# Patient Record
Sex: Female | Born: 2000 | Hispanic: No | Marital: Single | Smoking: Never smoker
Health system: Southern US, Community
[De-identification: ages and names within clinical notes are randomized; demographics above are authoritative.]

## PROBLEM LIST (undated history)

## (undated) ENCOUNTER — Inpatient Hospital Stay (HOSPITAL_COMMUNITY): Payer: Self-pay

## (undated) DIAGNOSIS — O09299 Supervision of pregnancy with other poor reproductive or obstetric history, unspecified trimester: Secondary | ICD-10-CM

## (undated) DIAGNOSIS — Z98891 History of uterine scar from previous surgery: Secondary | ICD-10-CM

---
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
## 2018-05-28 ENCOUNTER — Other Ambulatory Visit: Payer: Self-pay

## 2018-05-28 ENCOUNTER — Encounter (HOSPITAL_COMMUNITY): Payer: Self-pay | Admitting: Emergency Medicine

## 2018-05-28 ENCOUNTER — Ambulatory Visit (HOSPITAL_COMMUNITY)
Admission: EM | Admit: 2018-05-28 | Discharge: 2018-05-28 | Disposition: A | Discharge status: Home / Self Care | Payer: 59 | Attending: Family Medicine | Admitting: Family Medicine

## 2018-05-28 DIAGNOSIS — N939 Abnormal uterine and vaginal bleeding, unspecified: Secondary | ICD-10-CM

## 2018-05-28 LAB — POCT URINALYSIS DIP (DEVICE)
BILIRUBIN URINE: NEGATIVE
Glucose, UA: NEGATIVE mg/dL
Ketones, ur: NEGATIVE mg/dL
LEUKOCYTES UA: NEGATIVE
NITRITE: NEGATIVE
Protein, ur: NEGATIVE mg/dL
Specific Gravity, Urine: 1.025 (ref 1.005–1.030)
Urobilinogen, UA: 0.2 mg/dL (ref 0.0–1.0)
pH: 8.5 — ABNORMAL HIGH (ref 5.0–8.0)

## 2018-05-28 NOTE — Discharge Instructions (Addendum)
Negative urine pregnancy.  Infection can be a source of abnormal bleeding.  We have tested you today for sources of infection within the vagina. Will notify you of any positive findings and if any changes to treatment are needed.  You may monitor your results on your MyChart online as well.   Return to be seen or go to the ER if develop worsening of bleeding or heavy flow, dizziness, lightheadedness, pelvic pain or fevers.  Please establish with a primary care provider and/or gynecologist for further evaluation of persistent symptoms.

## 2018-05-28 NOTE — ED Triage Notes (Signed)
Spotting today, denies pain.  10/10 was her last period.  Patient wants a pregnancy test and std testing

## 2018-05-28 NOTE — ED Provider Notes (Signed)
[facility]    CSN: [id number] Arrival date & time: 05/28/18  1717     History   Chief Complaint Chief Complaint  Patient presents with  . Vaginal Bleeding    HPI Kathryn Clark is a 17 y.o. female.   Kathryn Clark presents with complaints of vaginal spotting which started yesterday. Hasn't increased. LMP was 10/10 and usually is regular. She is not on birth control. Denies any other vaginal discharge, itching or irritation. No sores or lesions. No pelvic pain, no back pain. No fevers. No urinary symptoms. Doesn't have a PCP or gynecologist. Sexually active with 1 partner, doesn't use condoms. No known STD exposure or concern. Without contributing medical history.      ROS per HPI.      History reviewed. No pertinent past medical history.  There are no active problems to display for this patient.   History reviewed. No pertinent surgical history.  OB History   None      Home Medications    Prior to Admission medications   Not on File    Family History Family History  Problem Relation Age of Onset  . Healthy Mother   . Healthy Father     Social History Social History   Tobacco Use  . Smoking status: Never Smoker  . Smokeless tobacco: Never Used  Substance Use Topics  . Alcohol use: No  . Drug use: No     Allergies   Patient has no known allergies.   Review of Systems Review of Systems   Physical Exam Triage Vital Signs ED Triage Vitals  Enc Vitals Group     BP 05/28/18 1729 (!) 142/77     Pulse Rate 05/28/18 1729 (!) 102     Resp 05/28/18 1729 16     Temp 05/28/18 1729 98.7 F (37.1 C)     Temp Source 05/28/18 1729 Oral     SpO2 05/28/18 1729 100 %     Weight --      Height --      Head Circumference --      Peak Flow --      Pain Score 05/28/18 1727 0     Pain Loc --      Pain Edu? --      Excl. in GC? --    No data found.  Updated Vital Signs BP (!) 142/77 (BP Location: Right Arm)   Pulse (!) 102   Temp 98.7 F  (37.1 C) (Oral)   Resp 16   LMP 05/10/2018   SpO2 100%    Physical Exam  Constitutional: She is oriented to person, place, and time. She appears well-developed and well-nourished. No distress.  Cardiovascular: Normal rate, regular rhythm and normal heart sounds.  Pulmonary/Chest: Effort normal and breath sounds normal.  Abdominal: Soft. There is no tenderness. There is no rigidity, no rebound, no guarding and no CVA tenderness.  Genitourinary: Uterus normal. There is no rash or lesion on the right labia. There is no rash or lesion on the left labia. Cervix exhibits no motion tenderness, no discharge and no friability. Right adnexum displays no tenderness. Left adnexum displays no tenderness. There is bleeding in the vagina.  Genitourinary Comments:  Scant dark red blood from cervix and within vagina; mild odor, no other obvious; no pain, no CMT  Neurological: She is alert and oriented to person, place, and time.  Skin: Skin is warm and dry.     UC Treatments / Results  Labs (all  labs ordered are listed, but only abnormal results are displayed) Labs Reviewed  POCT URINALYSIS DIP (DEVICE) - Abnormal; Notable for the following components:      Result Value   Hgb urine dipstick MODERATE (*)    pH 8.5 (*)    All other components within normal limits  CERVICOVAGINAL ANCILLARY ONLY    EKG None  Radiology No results found.  Procedures Procedures (including critical care time)  Medications Ordered in UC Medications - No data to display  Initial Impression / Assessment and Plan / UC Course  I have reviewed the triage vital signs and the nursing notes.  Pertinent labs & imaging results that were available during my care of the patient were reviewed by me and considered in my medical decision making (see chart for details).     Vaginal cytology pending. Will notify of any positive findings and if any changes to treatment are needed.  Spotting. No dizziness, lightheadedness. No  pain. No fevers. Awaiting final results to treat appropriately. Return precautions provided. If symptoms worsen or do not improve in the next week to return to be seen or to follow up with PCP or gynecologist. Patient verbalized understanding and agreeable to plan.     Final Clinical Impressions(s) / UC Diagnoses   Final diagnoses:  Abnormal uterine bleeding     Discharge Instructions     Negative urine pregnancy.  Infection can be a source of abnormal bleeding.  We have tested you today for sources of infection within the vagina. Will notify you of any positive findings and if any changes to treatment are needed.  You may monitor your results on your MyChart online as well.   Return to be seen or go to the ER if develop worsening of bleeding or heavy flow, dizziness, lightheadedness, pelvic pain or fevers.  Please establish with a primary care provider and/or gynecologist for further evaluation of persistent symptoms.    ED Prescriptions    None     Controlled Substance Prescriptions  Controlled Substance Registry consulted? Not Applicable   Georgetta Haber, NP 05/28/18 1758

## 2018-05-29 ENCOUNTER — Telehealth (HOSPITAL_COMMUNITY): Payer: Self-pay

## 2018-05-29 LAB — CERVICOVAGINAL ANCILLARY ONLY
BACTERIAL VAGINITIS: NEGATIVE
CANDIDA VAGINITIS: POSITIVE — AB
Chlamydia: NEGATIVE
Neisseria Gonorrhea: NEGATIVE
Trichomonas: NEGATIVE

## 2018-05-29 LAB — POCT PREGNANCY, URINE: PREG TEST UR: NEGATIVE

## 2018-05-29 MED ORDER — FLUCONAZOLE 150 MG PO TABS: 150.0000 mg | ORAL_TABLET | Freq: Every day | ORAL | 0 refills | Status: DC

## 2018-05-29 MED ORDER — FLUCONAZOLE 150 MG PO TABS: 150.0000 mg | ORAL_TABLET | Freq: Every day | ORAL | 0 refills | Status: AC

## 2018-05-29 NOTE — Telephone Encounter (Signed)
Test for candida (yeast) was positive.  Prescription for fluconazole 150mg po now, repeat dose in 3d if needed, #2 no refills, sent to the pharmacy of record.  Recheck or followup with PCP for further evaluation if symptoms are not improving.    Attempted to reach patient. No answer at this time. Voicemail left.     

## 2018-08-06 ENCOUNTER — Encounter (HOSPITAL_COMMUNITY): Payer: Self-pay

## 2018-08-06 ENCOUNTER — Emergency Department (HOSPITAL_COMMUNITY)
Admission: EM | Admit: 2018-08-06 | Discharge: 2018-08-07 | Disposition: A | Discharge status: Home / Self Care | Payer: 59 | Attending: Emergency Medicine | Admitting: Emergency Medicine

## 2018-08-06 ENCOUNTER — Other Ambulatory Visit: Payer: Self-pay

## 2018-08-06 DIAGNOSIS — O26891 Other specified pregnancy related conditions, first trimester: Secondary | ICD-10-CM | POA: Diagnosis not present

## 2018-08-06 DIAGNOSIS — O418X10 Other specified disorders of amniotic fluid and membranes, first trimester, not applicable or unspecified: Secondary | ICD-10-CM | POA: Diagnosis not present

## 2018-08-06 DIAGNOSIS — R1031 Right lower quadrant pain: Secondary | ICD-10-CM | POA: Diagnosis present

## 2018-08-06 DIAGNOSIS — R102 Pelvic and perineal pain: Secondary | ICD-10-CM | POA: Insufficient documentation

## 2018-08-06 DIAGNOSIS — R1032 Left lower quadrant pain: Secondary | ICD-10-CM | POA: Diagnosis not present

## 2018-08-06 DIAGNOSIS — Z3A01 Less than 8 weeks gestation of pregnancy: Secondary | ICD-10-CM | POA: Insufficient documentation

## 2018-08-06 DIAGNOSIS — O26899 Other specified pregnancy related conditions, unspecified trimester: Secondary | ICD-10-CM

## 2018-08-06 DIAGNOSIS — R1030 Lower abdominal pain, unspecified: Secondary | ICD-10-CM

## 2018-08-06 DIAGNOSIS — Z3401 Encounter for supervision of normal first pregnancy, first trimester: Secondary | ICD-10-CM

## 2018-08-06 DIAGNOSIS — O468X1 Other antepartum hemorrhage, first trimester: Secondary | ICD-10-CM

## 2018-08-06 NOTE — ED Triage Notes (Signed)
Pt reports RLQ and R sided cramping onset 1730 this evening after intercourse. Pt is currently 7 wks preg. Denies vag bleeding or loss of fluids. Denies N/V/D.

## 2018-08-07 ENCOUNTER — Emergency Department (HOSPITAL_COMMUNITY): Payer: 59

## 2018-08-07 DIAGNOSIS — O26891 Other specified pregnancy related conditions, first trimester: Secondary | ICD-10-CM | POA: Diagnosis not present

## 2018-08-07 LAB — URINALYSIS, ROUTINE W REFLEX MICROSCOPIC
BILIRUBIN URINE: NEGATIVE
Glucose, UA: NEGATIVE mg/dL
Hgb urine dipstick: NEGATIVE
KETONES UR: NEGATIVE mg/dL
LEUKOCYTES UA: NEGATIVE
NITRITE: NEGATIVE
Protein, ur: NEGATIVE mg/dL
Specific Gravity, Urine: 1.025 (ref 1.005–1.030)
pH: 6 (ref 5.0–8.0)

## 2018-08-07 LAB — LIPASE, BLOOD: LIPASE: 33 U/L (ref 11–51)

## 2018-08-07 LAB — COMPREHENSIVE METABOLIC PANEL
ALT: 9 U/L (ref 0–44)
AST: 16 U/L (ref 15–41)
Albumin: 3.9 g/dL (ref 3.5–5.0)
Alkaline Phosphatase: 44 U/L (ref 38–126)
Anion gap: 7 (ref 5–15)
BILIRUBIN TOTAL: 0.7 mg/dL (ref 0.3–1.2)
BUN: 5 mg/dL — AB (ref 6–20)
CHLORIDE: 105 mmol/L (ref 98–111)
CO2: 23 mmol/L (ref 22–32)
CREATININE: 0.64 mg/dL (ref 0.44–1.00)
Calcium: 9.3 mg/dL (ref 8.9–10.3)
GFR calc non Af Amer: >60 mL/min (ref >60)
Glucose, Bld: 78 mg/dL (ref 70–99)
POTASSIUM: 3.6 mmol/L (ref 3.5–5.1)
Sodium: 135 mmol/L (ref 135–145)
TOTAL PROTEIN: 6.5 g/dL (ref 6.5–8.1)

## 2018-08-07 LAB — CBC
HCT: 34.9 % — ABNORMAL LOW (ref 36.0–46.0)
HEMOGLOBIN: 11.2 g/dL — AB (ref 12.0–15.0)
MCH: 29.2 pg (ref 26.0–34.0)
MCHC: 32.1 g/dL (ref 30.0–36.0)
MCV: 90.9 fL (ref 80.0–100.0)
NRBC: 0 % (ref 0.0–0.2)
Platelets: 193 10*3/uL (ref 150–400)
RBC: 3.84 MIL/uL — ABNORMAL LOW (ref 3.87–5.11)
RDW: 13.1 % (ref 11.5–15.5)
WBC: 7.1 10*3/uL (ref 4.0–10.5)

## 2018-08-07 LAB — HCG, QUANTITATIVE, PREGNANCY: HCG, BETA CHAIN, QUANT, S: 70844 m[IU]/mL — AB (ref <5)

## 2018-08-07 NOTE — ED Provider Notes (Signed)
[facility] EMERGENCY DEPARTMENT Provider Note   CSN: [id number] Arrival date & time: 08/06/18  2308     History   Chief Complaint Chief Complaint  Patient presents with  . Abdominal Pain    HPI Kathryn Clark is a 18 y.o. female.  Pt reports RLQ and R sided cramping onset 1730 this evening after intercourse. Pt is currently 7 wks preg. Denies vag bleeding or loss of fluids. Denies N/V/D. Pain has improved.  No cough or URI symptoms.  No prior pregnancies.  Uncomplicated thus far.    The history is provided by the patient. No language interpreter was used.  Abdominal Pain  Pain location:  RLQ and LLQ Pain quality: cramping   Pain radiates to:  Does not radiate Pain severity:  Mild Onset quality:  Sudden Duration:  5 hours Timing:  Intermittent Progression:  Unchanged Chronicity:  New Context: recent sexual activity   Relieved by:  None tried Worsened by:  Nothing Ineffective treatments:  None tried Associated symptoms: no anorexia, no chest pain, no chills, no constipation, no cough, no fever, no shortness of breath, no vaginal bleeding, no vaginal discharge and no vomiting     History reviewed. No pertinent past medical history.  There are no active problems to display for this patient.   History reviewed. No pertinent surgical history.   OB History    Gravida  2   Para      Term      Preterm      AB      Living        SAB      TAB      Ectopic      Multiple      Live Births               Home Medications    Prior to Admission medications   Not on File    Family History Family History  Problem Relation Age of Onset  . Healthy Mother   . Healthy Father     Social History Social History   Tobacco Use  . Smoking status: Never Smoker  . Smokeless tobacco: Never Used  Substance Use Topics  . Alcohol use: No  . Drug use: No     Allergies   Patient has no known allergies.   Review of Systems Review of  Systems  Constitutional: Negative for chills and fever.  Respiratory: Negative for cough and shortness of breath.   Cardiovascular: Negative for chest pain.  Gastrointestinal: Positive for abdominal pain. Negative for anorexia, constipation and vomiting.  Genitourinary: Negative for vaginal bleeding and vaginal discharge.  All other systems reviewed and are negative.    Physical Exam Updated Vital Signs BP 120/78   Pulse 87   Temp 99 F (37.2 C) (Oral)   Resp 20   Ht 5\' 4"  (1.626 m)   Wt 66.2 kg   LMP 06/20/2018   SpO2 100%   BMI 25.06 kg/m   Physical Exam Vitals signs and nursing note reviewed.  Constitutional:      Appearance: She is well-developed.  HENT:     Head: Normocephalic and atraumatic.     Right Ear: External ear normal.     Left Ear: External ear normal.  Eyes:     Conjunctiva/sclera: Conjunctivae normal.  Neck:     Musculoskeletal: Normal range of motion and neck supple.  Cardiovascular:     Rate and Rhythm: Normal rate.  Heart sounds: Normal heart sounds.  Pulmonary:     Effort: Pulmonary effort is normal.     Breath sounds: Normal breath sounds.  Abdominal:     General: Bowel sounds are normal.     Palpations: Abdomen is soft.     Tenderness: There is no abdominal tenderness. There is no rebound.     Comments: No pain to palpation of the rlq or llq on exam.  No rebound, no guarding.   Musculoskeletal: Normal range of motion.  Skin:    General: Skin is warm.  Neurological:     Mental Status: She is alert and oriented to person, place, and time.      ED Treatments / Results  Labs (all labs ordered are listed, but only abnormal results are displayed) Labs Reviewed  COMPREHENSIVE METABOLIC PANEL - Abnormal; Notable for the following components:      Result Value   BUN 5 (*)    All other components within normal limits  CBC - Abnormal; Notable for the following components:   RBC 3.84 (*)    Hemoglobin 11.2 (*)    HCT 34.9 (*)    All  other components within normal limits  HCG, QUANTITATIVE, PREGNANCY - Abnormal; Notable for the following components:   hCG, Beta Chain, Quant, S [id number],56270,[id number] (*)    All other components within normal limits  LIPASE, BLOOD  URINALYSIS, ROUTINE W REFLEX MICROSCOPIC    EKG None  Radiology [place] Ob Comp Less 14 Wks  Result Date: 08/07/2018 CLINICAL DATA:  Right lower quadrant pain.  Beta HCG [id number] EXAM: OBSTETRIC <14 WK ULTRASOUND TECHNIQUE: Transabdominal ultrasound was performed for evaluation of the gestation as well as the maternal uterus and adnexal regions. COMPARISON:  None. FINDINGS: Intrauterine gestational sac: Single Yolk sac:  Not Visualized. Embryo:  Visualized. Cardiac Activity: Visualized. Heart Rate: 140 bpm CRL:   8.1 mm   6 w 5 d                  US EDC: 03/28/2019 Subchorionic hemorrhage: Small 13 x 6 x 7 mm focus of perigestational hemorrhage. Maternal uterus/adnexae: The left ovary was not visualized. The right ovary was unremarkable measuring 3.9 x 3.1 x 2.5 cm. The uterus measured 9.4 x 5.6 x 7.1 cm. IMPRESSION: Viable 6 week 5 day single intrauterine gestation with adjacent small subchorionic focus of hemorrhage measuring 13 x 6 x 7 mm. Electronically Signed   By: Tollie Ethavid  Kwon M.D.   On: 08/07/2018 03:53    Procedures Procedures (including critical care time)  Medications Ordered in ED Medications - No data to display   Initial Impression / Assessment and Plan / ED Course  I have reviewed the triage vital signs and the nursing notes.  Pertinent labs & imaging results that were available during my care of the patient were reviewed by me and considered in my medical decision making (see chart for details).     18 year old who is [redacted] weeks pregnant who presents for acute onset of right lower quadrant pain and left lower quadrant pain.  No vaginal bleeding, no vaginal discharge.  No loss of fluids.  Symptoms started after intercourse.  Given history, will obtain quant hCG,  lipase, electrolytes, CBC, UA and urine culture.  UA without signs of infection, hCG consistent with 7-week pregnancy.  Normal electrolytes and lipase.  Ultrasound visualized by me noted to have intrauterine pregnancy.  Small subchorionic hemorrhage.  Discussed subchorionic hemorrhage with [facility] provider, who suggest the patient follow-up  with primary OB doctor, but no need for emergent referral.  Discussed findings with patient.  Continue to use ibuprofen as needed for pain.    Final Clinical Impressions(s) / ED Diagnoses   Final diagnoses:  Encounter for supervision of normal first pregnancy in first trimester  Lower abdominal pain  Subchorionic hematoma in first trimester, single or unspecified fetus    ED Discharge Orders    None       Niel HummerKuhner, Allis Quirarte, MD 08/07/18 [phone]

## 2018-08-07 NOTE — ED Notes (Signed)
Patient transported to US 

## 2018-08-21 LAB — OB RESULTS CONSOLE HEPATITIS B SURFACE ANTIGEN: Hepatitis B Surface Ag: NEGATIVE

## 2018-08-21 LAB — OB RESULTS CONSOLE GBS: GBS: POSITIVE

## 2018-08-21 LAB — OB RESULTS CONSOLE HIV ANTIBODY (ROUTINE TESTING): HIV: NONREACTIVE

## 2018-08-21 LAB — OB RESULTS CONSOLE RUBELLA ANTIBODY, IGM: Rubella: IMMUNE

## 2018-09-23 ENCOUNTER — Encounter (HOSPITAL_COMMUNITY): Payer: Self-pay

## 2018-09-23 ENCOUNTER — Inpatient Hospital Stay (HOSPITAL_COMMUNITY)
Admission: AD | Admit: 2018-09-23 | Discharge: 2018-09-23 | Disposition: A | Discharge status: Home / Self Care | Payer: 59 | Attending: Obstetrics and Gynecology | Admitting: Obstetrics and Gynecology

## 2018-09-23 DIAGNOSIS — Z3A13 13 weeks gestation of pregnancy: Secondary | ICD-10-CM | POA: Insufficient documentation

## 2018-09-23 DIAGNOSIS — O210 Mild hyperemesis gravidarum: Secondary | ICD-10-CM | POA: Insufficient documentation

## 2018-09-23 LAB — URINALYSIS, ROUTINE W REFLEX MICROSCOPIC
Bilirubin Urine: NEGATIVE
Glucose, UA: NEGATIVE mg/dL
Hgb urine dipstick: NEGATIVE
Ketones, ur: NEGATIVE mg/dL
LEUKOCYTE UA: NEGATIVE
NITRITE: NEGATIVE
PROTEIN: NEGATIVE mg/dL
SPECIFIC GRAVITY, URINE: 1.01 (ref 1.005–1.030)
pH: 9 — ABNORMAL HIGH (ref 5.0–8.0)

## 2018-09-23 MED ORDER — RANITIDINE HCL 150 MG PO TABS: 150.0000 mg | ORAL_TABLET | Freq: Two times a day (BID) | ORAL | 0 refills | Status: DC

## 2018-09-23 MED ORDER — SODIUM CHLORIDE 0.9 % IV SOLN
8.0000 mg | Freq: Once | INTRAVENOUS | Status: AC
  Administered 2018-09-23: 8 mg via INTRAVENOUS
  Filled 2018-09-23: qty 4

## 2018-09-23 MED ORDER — PROMETHAZINE HCL 12.5 MG PO TABS: 12.5000 mg | ORAL_TABLET | Freq: Four times a day (QID) | ORAL | 0 refills | Status: DC | PRN

## 2018-09-23 MED ORDER — LACTATED RINGERS IV SOLN
Freq: Once | INTRAVENOUS | Status: AC
  Administered 2018-09-23: 20:00:00 via INTRAVENOUS
  Filled 2018-09-23: qty 1000

## 2018-09-23 MED ORDER — PROMETHAZINE HCL 25 MG PO TABS
25.0000 mg | ORAL_TABLET | Freq: Once | ORAL | Status: AC
  Administered 2018-09-23: 25 mg via ORAL
  Filled 2018-09-23: qty 1

## 2018-09-23 NOTE — MAU Provider Note (Signed)
Chief Complaint: Nausea and Fatigue   First Provider Initiated Contact with Patient 09/23/18 1912     SUBJECTIVE HPI: Kathryn Clark is a 18 y.o. G1P0 at [redacted]w[redacted]d by LMP who presents to M[facility] Assessment Unit reporting N/V since last night, vomited 4-5x in 24 hrs, and generalized weakness/fatigue. She states that she woke up at 0400 feeling hungry, so she ate something. She then vomited. While at work she ate something at 1300 and then vomited, she ate something again, then vomited, ate 2 crackers, applesauce and some water then she vomited. She drank some water and then vomited again. She denies any recent sick contacts. She denies abdominal pain/cramping.  History reviewed. No pertinent past medical history. OB History  Gravida Para Term Preterm AB Living  1            SAB TAB Ectopic Multiple Live Births               # Outcome Date GA Lbr Len/2nd Weight Sex Delivery Anes PTL Lv  1 Current            History reviewed. No pertinent surgical history. Social History   Socioeconomic History  . Marital status: Single    Spouse name: Not on file  . Number of children: Not on file  . Years of education: Not on file  . Highest education level: Not on file  Occupational History  . Not on file  Social Needs  . Financial resource strain: Not on file  . Food insecurity:    Worry: Not on file    Inability: Not on file  . Transportation needs:    Medical: Not on file    Non-medical: Not on file  Tobacco Use  . Smoking status: Never Smoker  . Smokeless tobacco: Never Used  Substance and Sexual Activity  . Alcohol use: No  . Drug use: No  . Sexual activity: Yes    Birth control/protection: None  Lifestyle  . Physical activity:    Days per week: Not on file    Minutes per session: Not on file  . Stress: Not on file  Relationships  . Social connections:    Talks on phone: Not on file    Gets together: Not on file    Attends religious service: Not on file    Active member of club  or organization: Not on file    Attends meetings of clubs or organizations: Not on file    Relationship status: Not on file  . Intimate partner violence:    Fear of current or ex partner: Not on file    Emotionally abused: Not on file    Physically abused: Not on file    Forced sexual activity: Not on file  Other Topics Concern  . Not on file  Social History Narrative  . Not on file   Family History  Problem Relation Age of Onset  . Healthy Mother   . Healthy Father    No current facility-administered medications on file prior to encounter.    No current outpatient medications on file prior to encounter.   No Known Allergies  I have reviewed patient's Past Medical Hx, Surgical Hx, Family Hx, Social Hx, medications and allergies.   Review of Systems  Constitutional: Positive for appetite change and fatigue.  HENT: Negative.   Eyes: Negative.   Respiratory: Negative.   Cardiovascular: Negative.   Gastrointestinal: Positive for nausea and vomiting (5 times since 0400 today).  Endocrine: Negative.  Genitourinary: Negative.   Musculoskeletal: Negative.   Skin: Negative.   Allergic/Immunologic: Negative.   Neurological: Positive for weakness.  Hematological: Negative.   Psychiatric/Behavioral: Negative.     OBJECTIVE Patient Vitals for the past 24 hrs:  BP Temp Pulse Resp SpO2 Height Weight  09/23/18 2203 (!) 105/57 - 62 - - - -  09/23/18 1834 124/86 98.9 F (37.2 C) 91 17 98 % - -  09/23/18 1833 - - - - - 5\' 4"  (1.626 m) 67.1 kg   Constitutional: Well-developed, well-nourished female in no acute distress.  Cardiovascular: normal rate & rhythm, no murmur Respiratory: normal rate and effort. Lung sounds clear throughout GI: Abd soft, non-tender, hypoactive BS x 4. No guarding or rebound tenderness MS: Extremities nontender, no edema, normal ROM Neurologic: Alert and oriented x 4.  GU: Deferred    LAB RESULTS Results for orders placed or performed during the  hospital encounter of 09/23/18 (from the past 24 hour(s))  Urinalysis, Routine w reflex microscopic     Status: Abnormal   Collection Time: 09/23/18  7:01 PM  Result Value Ref Range   Color, Urine YELLOW YELLOW   APPearance CLEAR CLEAR   Specific Gravity, Urine 1.010 1.005 - 1.030   pH 9.0 (H) 5.0 - 8.0   Glucose, UA NEGATIVE NEGATIVE mg/dL   Hgb urine dipstick NEGATIVE NEGATIVE   Bilirubin Urine NEGATIVE NEGATIVE   Ketones, ur NEGATIVE NEGATIVE mg/dL   Protein, ur NEGATIVE NEGATIVE mg/dL   Nitrite NEGATIVE NEGATIVE   Leukocytes,Ua NEGATIVE NEGATIVE    MAU COURSE Orders Placed This Encounter  Procedures  . Urinalysis, Routine w reflex microscopic  . Discharge patient   Meds ordered this encounter  Medications  . AND Linked Order Group   . lactated ringers 1,000 mL with multivitamins adult (INFUVITE ADULT) 10 mL infusion   . promethazine (PHENERGAN) tablet 25 mg  . ondansetron (ZOFRAN) 8 mg in sodium chloride 0.9 % 50 mL IVPB  . promethazine (PHENERGAN) 12.5 MG tablet    Sig: Take 1 tablet (12.5 mg total) by mouth every 6 (six) hours as needed for nausea or vomiting.    Dispense:  30 tablet    Refill:  0    Order Specific Question:   Supervising Provider    Answer:   Reva Bores [[id number]]  . ranitidine (ZANTAC) 150 MG tablet    Sig: Take 1 tablet (150 mg total) by mouth 2 (two) times daily.    Dispense:  60 tablet    Refill:  0    Order Specific Question:   Supervising Provider    Answer:   Reva Bores [[id number]]    MDM Phenergan 25 mg IM injection IVFs: MVI in LR 1000 ml @ 999 ml/hr -- improved nausea/vomiting PO Challenge -- patient tolerated well   ASSESSMENT 1. Morning sickness     PLAN Discharge home in stable condition. Information provided on morning sickness List of [facility] OB provider list given Advised to get [facility] ASAP   Allergies as of 09/23/2018   No Known Allergies     Medication List    TAKE these medications   promethazine 12.5 MG  tablet Commonly known as:  PHENERGAN Take 1 tablet (12.5 mg total) by mouth every 6 (six) hours as needed for nausea or vomiting.   ranitidine 150 MG tablet Commonly known as:  ZANTAC Take 1 tablet (150 mg total) by mouth 2 (two) times daily.        Raelyn Mora, CNM 09/23/2018  7:12 PM

## 2018-09-23 NOTE — Discharge Instructions (Signed)
[facility] Prenatal Care Providers °         °[facility]  ° Phone: [phone] ° °[facility] @ [facility]  ° Phone: [phone] ° °[facility]      ° Phone: [phone]   °         °[facility] @ [place]    ° Phone: [phone] °         °[facility] @ [place]  ° Phone: [phone] ° °[facility] @ [facility] ° Phone: [phone] °    °[facility]) ° Phone: [phone] °Safe Medications in Pregnancy  ° °Acne: °Benzoyl Peroxide °Salicylic Acid ° °Backache/Headache: °Tylenol: 2 regular strength every 4 hours OR °             2 Extra strength every 6 hours ° °Colds/Coughs/Allergies: °Benadryl (alcohol free) 25 mg every 6 hours as needed °Breath right strips °Claritin °Cepacol throat lozenges °Chloraseptic throat spray °Cold-Eeze- up to three times per day °Cough drops, alcohol free °Flonase (by prescription only) °Guaifenesin °Mucinex °Robitussin DM (plain only, alcohol free) °Saline nasal spray/drops °Sudafed (pseudoephedrine) & Actifed ** use only after [redacted] weeks gestation and if you do not have high blood pressure °Tylenol °Vicks Vaporub °Zinc lozenges °Zyrtec  ° °Constipation: °Colace °Ducolax suppositories °Fleet enema °Glycerin suppositories °Metamucil °Milk of magnesia °Miralax °Senokot °Smooth move tea ° °Diarrhea: °Kaopectate °Imodium A-D ° °*NO pepto Bismol ° °Hemorrhoids: °Anusol °Anusol HC °Preparation H °Tucks ° °Indigestion: °Tums °Maalox °Mylanta °Zantac  °Pepcid ° °Insomnia: °Benadryl (alcohol free) 25mg every 6 hours as needed °Tylenol PM °Unisom, no Gelcaps ° °Leg Cramps: °Tums °MagGel ° °Nausea/Vomiting:  °Bonine °Dramamine °Emetrol °Ginger extract °Sea bands °Meclizine  °Nausea medication to take during pregnancy:  °Unisom (doxylamine succinate 25 mg tablets) Take one tablet daily at bedtime. If symptoms are not adequately controlled, the dose  can be increased to a maximum recommended dose of two tablets daily (1/2 tablet in the morning, 1/2 tablet mid-afternoon and one at bedtime). °Vitamin B6 100mg tablets. Take one tablet twice a day (up to 200 mg per day). ° °Skin Rashes: °Aveeno products °Benadryl cream or 25mg every 6 hours as needed °Calamine Lotion °1% cortisone cream ° °Yeast infection: °Gyne-lotrimin 7 °Monistat 7 ° ° °**If taking multiple medications, please check labels to avoid duplicating the same active ingredients °**take medication as directed on the label °** Do not exceed 4000 mg of tylenol in 24 hours °**Do not take medications that contain aspirin or ibuprofen ° ° ° ° °

## 2018-09-23 NOTE — MAU Note (Addendum)
Pt reports that she has had nausea and vomiting since last night. Reports that she has vomited 4-5x in the last 24 hours. Reports that she cannot keep anything down. Reports generalized weakness and fatigue. Denies recent sick contacts. Pt is [redacted] weeks pregnant.

## 2019-01-07 ENCOUNTER — Encounter (HOSPITAL_COMMUNITY): Payer: Self-pay

## 2019-01-07 ENCOUNTER — Inpatient Hospital Stay (HOSPITAL_COMMUNITY)
Admission: AD | Admit: 2019-01-07 | Discharge: 2019-01-07 | Disposition: A | Discharge status: Home / Self Care | Payer: 59 | Attending: Obstetrics and Gynecology | Admitting: Obstetrics and Gynecology

## 2019-01-07 ENCOUNTER — Other Ambulatory Visit: Payer: Self-pay

## 2019-01-07 DIAGNOSIS — Z0371 Encounter for suspected problem with amniotic cavity and membrane ruled out: Secondary | ICD-10-CM | POA: Diagnosis not present

## 2019-01-07 DIAGNOSIS — O26893 Other specified pregnancy related conditions, third trimester: Secondary | ICD-10-CM | POA: Insufficient documentation

## 2019-01-07 DIAGNOSIS — O210 Mild hyperemesis gravidarum: Secondary | ICD-10-CM | POA: Insufficient documentation

## 2019-01-07 DIAGNOSIS — Z3A28 28 weeks gestation of pregnancy: Secondary | ICD-10-CM | POA: Diagnosis not present

## 2019-01-07 LAB — URINALYSIS, ROUTINE W REFLEX MICROSCOPIC
Bilirubin Urine: NEGATIVE
Glucose, UA: NEGATIVE mg/dL
Hgb urine dipstick: NEGATIVE
Ketones, ur: NEGATIVE mg/dL
Nitrite: NEGATIVE
Protein, ur: NEGATIVE mg/dL
Specific Gravity, Urine: 1.01 (ref 1.005–1.030)
pH: 8 (ref 5.0–8.0)

## 2019-01-07 LAB — WET PREP, GENITAL
Clue Cells Wet Prep HPF POC: NONE SEEN
Sperm: NONE SEEN
Trich, Wet Prep: NONE SEEN
Yeast Wet Prep HPF POC: NONE SEEN

## 2019-01-07 LAB — AMNISURE RUPTURE OF MEMBRANE (ROM) NOT AT ARMC: Amnisure ROM: NEGATIVE

## 2019-01-07 NOTE — Discharge Instructions (Signed)
Third Trimester of Pregnancy The third trimester is from week 28 through week 40 (months 7 through 9). The third trimester is a time when the unborn baby (fetus) is growing rapidly. At the end of the ninth month, the fetus is about 20 inches in length and weighs 6-10 pounds. Body changes during your third trimester Your body will continue to go through many changes during pregnancy. The changes vary from woman to woman. During the third trimester:  Your weight will continue to increase. You can expect to gain 25-35 pounds (11-16 kg) by the end of the pregnancy.  You may begin to get stretch marks on your hips, abdomen, and breasts.  You may urinate more often because the fetus is moving lower into your pelvis and pressing on your bladder.  You may develop or continue to have heartburn. This is caused by increased hormones that slow down muscles in the digestive tract.  You may develop or continue to have constipation because increased hormones slow digestion and cause the muscles that push waste through your intestines to relax.  You may develop hemorrhoids. These are swollen veins (varicose veins) in the rectum that can itch or be painful.  You may develop swollen, bulging veins (varicose veins) in your legs.  You may have increased body aches in the pelvis, back, or thighs. This is due to weight gain and increased hormones that are relaxing your joints.  You may have changes in your hair. These can include thickening of your hair, rapid growth, and changes in texture. Some women also have hair loss during or after pregnancy, or hair that feels dry or thin. Your hair will most likely return to normal after your baby is born.  Your breasts will continue to grow and they will continue to become tender. A yellow fluid (colostrum) may leak from your breasts. This is the first milk you are producing for your baby.  Your belly button may stick out.  You may notice more swelling in your hands,  face, or ankles.  You may have increased tingling or numbness in your hands, arms, and legs. The skin on your belly may also feel numb.  You may feel short of breath because of your expanding uterus.  You may have more problems sleeping. This can be caused by the size of your belly, increased need to urinate, and an increase in your body's metabolism.  You may notice the fetus "dropping," or moving lower in your abdomen (lightening).  You may have increased vaginal discharge.  You may notice your joints feel loose and you may have pain around your pelvic bone. What to expect at prenatal visits You will have prenatal exams every 2 weeks until week 36. Then you will have weekly prenatal exams. During a routine prenatal visit:  You will be weighed to make sure you and the baby are growing normally.  Your blood pressure will be taken.  Your abdomen will be measured to track your baby's growth.  The fetal heartbeat will be listened to.  Any test results from the previous visit will be discussed.  You may have a cervical check near your due date to see if your cervix has softened or thinned (effaced).  You will be tested for Group B streptococcus. This happens between 35 and 37 weeks. Your health care provider may ask you:  What your birth plan is.  How you are feeling.  If you are feeling the baby move.  If you have had any abnormal   symptoms, such as leaking fluid, bleeding, severe headaches, or abdominal cramping.  If you are using any tobacco products, including cigarettes, chewing tobacco, and electronic cigarettes.  If you have any questions. Other tests or screenings that may be performed during your third trimester include:  Blood tests that check for low iron levels (anemia).  Fetal testing to check the health, activity level, and growth of the fetus. Testing is done if you have certain medical conditions or if there are problems during the pregnancy.  Nonstress test  (NST). This test checks the health of your baby to make sure there are no signs of problems, such as the baby not getting enough oxygen. During this test, a belt is placed around your belly. The baby is made to move, and its heart rate is monitored during movement. What is false labor? False labor is a condition in which you feel small, irregular tightenings of the muscles in the womb (contractions) that usually go away with rest, changing position, or drinking water. These are called Braxton Hicks contractions. Contractions may last for hours, days, or even weeks before true labor sets in. If contractions come at regular intervals, become more frequent, increase in intensity, or become painful, you should see your health care provider. What are the signs of labor?  Abdominal cramps.  Regular contractions that start at 10 minutes apart and become stronger and more frequent with time.  Contractions that start on the top of the uterus and spread down to the lower abdomen and back.  Increased pelvic pressure and dull back pain.  A watery or bloody mucus discharge that comes from the vagina.  Leaking of amniotic fluid. This is also known as your "water breaking." It could be a slow trickle or a gush. Let your health care provider know if it has a color or strange odor. If you have any of these signs, call your health care provider right away, even if it is before your due date. Follow these instructions at home: Medicines  Follow your health care provider's instructions regarding medicine use. Specific medicines may be either safe or unsafe to take during pregnancy.  Take a prenatal vitamin that contains at least 600 micrograms (mcg) of folic acid.  If you develop constipation, try taking a stool softener if your health care provider approves. Eating and drinking   Eat a balanced diet that includes fresh fruits and vegetables, whole grains, good sources of protein such as meat, eggs, or tofu,  and low-fat dairy. Your health care provider will help you determine the amount of weight gain that is right for you.  Avoid raw meat and uncooked cheese. These carry germs that can cause birth defects in the baby.  If you have low calcium intake from food, talk to your health care provider about whether you should take a daily calcium supplement.  Eat four or five small meals rather than three large meals a day.  Limit foods that are high in fat and processed sugars, such as fried and sweet foods.  To prevent constipation: ? Drink enough fluid to keep your urine clear or pale yellow. ? Eat foods that are high in fiber, such as fresh fruits and vegetables, whole grains, and beans. Activity  Exercise only as directed by your health care provider. Most women can continue their usual exercise routine during pregnancy. Try to exercise for 30 minutes at least 5 days a week. Stop exercising if you experience uterine contractions.  Avoid heavy lifting.  Do   not exercise in extreme heat or humidity, or at high altitudes.  Wear low-heel, comfortable shoes.  Practice good posture.  You may continue to have sex unless your health care provider tells you otherwise. Relieving pain and discomfort  Take frequent breaks and rest with your legs elevated if you have leg cramps or low back pain.  Take warm sitz baths to soothe any pain or discomfort caused by hemorrhoids. Use hemorrhoid cream if your health care provider approves.  Wear a good support bra to prevent discomfort from breast tenderness.  If you develop varicose veins: ? Wear support pantyhose or compression stockings as told by your healthcare provider. ? Elevate your feet for 15 minutes, 3-4 times a day. Prenatal care  Write down your questions. Take them to your prenatal visits.  Keep all your prenatal visits as told by your health care provider. This is important. Safety  Wear your seat belt at all times when driving.  Make  a list of emergency phone numbers, including numbers for family, friends, the hospital, and police and fire departments. General instructions  Avoid cat litter boxes and soil used by cats. These carry germs that can cause birth defects in the baby. If you have a cat, ask someone to clean the litter box for you.  Do not travel far distances unless it is absolutely necessary and only with the approval of your health care provider.  Do not use hot tubs, steam rooms, or saunas.  Do not drink alcohol.  Do not use any products that contain nicotine or tobacco, such as cigarettes and e-cigarettes. If you need help quitting, ask your health care provider.  Do not use any medicinal herbs or unprescribed drugs. These chemicals affect the formation and growth of the baby.  Do not douche or use tampons or scented sanitary pads.  Do not cross your legs for long periods of time.  To prepare for the arrival of your baby: ? Take prenatal classes to understand, practice, and ask questions about labor and delivery. ? Make a trial run to the hospital. ? Visit the hospital and tour the maternity area. ? Arrange for maternity or paternity leave through employers. ? Arrange for family and friends to take care of pets while you are in the hospital. ? Purchase a rear-facing car seat and make sure you know how to install it in your car. ? Pack your hospital bag. ? Prepare the baby's nursery. Make sure to remove all pillows and stuffed animals from the baby's crib to prevent suffocation.  Visit your dentist if you have not gone during your pregnancy. Use a soft toothbrush to brush your teeth and be gentle when you floss. Contact a health care provider if:  You are unsure if you are in labor or if your water has broken.  You become dizzy.  You have mild pelvic cramps, pelvic pressure, or nagging pain in your abdominal area.  You have lower back pain.  You have persistent nausea, vomiting, or  diarrhea.  You have an unusual or bad smelling vaginal discharge.  You have pain when you urinate. Get help right away if:  Your water breaks before 37 weeks.  You have regular contractions less than 5 minutes apart before 37 weeks.  You have a fever.  You are leaking fluid from your vagina.  You have spotting or bleeding from your vagina.  You have severe abdominal pain or cramping.  You have rapid weight loss or weight gain.  You have   shortness of breath with chest pain.  You notice sudden or extreme swelling of your face, hands, ankles, feet, or legs.  Your baby makes fewer than 10 movements in 2 hours.  You have severe headaches that do not go away when you take medicine.  You have vision changes. Summary  The third trimester is from week 28 through week 40, months 7 through 9. The third trimester is a time when the unborn baby (fetus) is growing rapidly.  During the third trimester, your discomfort may increase as you and your baby continue to gain weight. You may have abdominal, leg, and back pain, sleeping problems, and an increased need to urinate.  During the third trimester your breasts will keep growing and they will continue to become tender. A yellow fluid (colostrum) may leak from your breasts. This is the first milk you are producing for your baby.  False labor is a condition in which you feel small, irregular tightenings of the muscles in the womb (contractions) that eventually go away. These are called Braxton Hicks contractions. Contractions may last for hours, days, or even weeks before true labor sets in.  Signs of labor can include: abdominal cramps; regular contractions that start at 10 minutes apart and become stronger and more frequent with time; watery or bloody mucus discharge that comes from the vagina; increased pelvic pressure and dull back pain; and leaking of amniotic fluid. This information is not intended to replace advice given to you by your  health care provider. Make sure you discuss any questions you have with your health care provider. Document Released: 07/12/2001 Document Revised: 08/23/2016 Document Reviewed: 08/23/2016 [employer] Interactive Patient Education  2019 [employer]  

## 2019-01-07 NOTE — MAU Provider Note (Signed)
History     CSN: [id number]  Arrival date and time: 01/07/19 1243   First Provider Initiated Contact with Patient 01/07/19 1327      Chief Complaint  Patient presents with  . Rupture of Membranes   Kathryn Clark is a 18 y.o. G1P0 at 2967w5d who presents today with leaking fluid. She states that around 1130 this morning she had a small gush of fluid. She has not had any since. She has not needed to wear a pad. She denies any VB or contractions. She reports normal fetal movement.   Vaginal Discharge  The patient's primary symptoms include vaginal discharge. The patient's pertinent negatives include no pelvic pain. This is a new problem. The current episode started today. The patient is experiencing no pain. She is pregnant. Pertinent negatives include no chills, dysuria, fever, frequency, nausea or vomiting. The vaginal discharge was watery. There has been no bleeding. Nothing aggravates the symptoms. She has tried nothing for the symptoms. Sexual activity: denies intercourse in the last 24 hours.     OB History    Gravida  1   Para      Term      Preterm      AB      Living        SAB      TAB      Ectopic      Multiple      Live Births              History reviewed. No pertinent past medical history.  History reviewed. No pertinent surgical history.  Family History  Problem Relation Age of Onset  . Healthy Mother   . Healthy Father     Social History   Tobacco Use  . Smoking status: Never Smoker  . Smokeless tobacco: Never Used  Substance Use Topics  . Alcohol use: No  . Drug use: No    Allergies: No Known Allergies  Medications Prior to Admission  Medication Sig Dispense Refill Last Dose  . promethazine (PHENERGAN) 12.5 MG tablet Take 1 tablet (12.5 mg total) by mouth every 6 (six) hours as needed for nausea or vomiting. 30 tablet 0   . ranitidine (ZANTAC) 150 MG tablet Take 1 tablet (150 mg total) by mouth 2 (two) times daily. 60 tablet 0      Review of Systems  Constitutional: Negative for chills and fever.  Gastrointestinal: Negative for nausea and vomiting.  Genitourinary: Positive for vaginal discharge. Negative for dysuria, frequency and pelvic pain.   Physical Exam   Blood pressure 121/73, pulse (!) 105, temperature 98.3 F (36.8 C), temperature source Oral, resp. rate 16, last menstrual period 06/20/2018, SpO2 98 %.  Physical Exam  Nursing note and vitals reviewed. Constitutional: She is oriented to person, place, and time. She appears well-developed and well-nourished. No distress.  HENT:  Head: Normocephalic.  Cardiovascular: Normal rate.  Respiratory: Effort normal.  GI: Soft. There is no abdominal tenderness. There is no rebound.  Genitourinary:    Genitourinary Comments:  External: no lesion Vagina: small amount of white discharge, no pooling  Cervix: pink, smooth, no fluid seen with valsalva, closed/thick  Uterus:  AGA    Neurological: She is alert and oriented to person, place, and time.  Skin: Skin is warm and dry.  Psychiatric: She has a normal mood and affect.   NST:  Baseline: 150 Variability: moderate Accels: 15x15 Decels: none Toco: none  Results for orders placed or performed during the  hospital encounter of 01/07/19 (from the past 24 hour(s))  Urinalysis, Routine w reflex microscopic     Status: Abnormal   Collection Time: 01/07/19  1:40 PM  Result Value Ref Range   Color, Urine YELLOW YELLOW   APPearance HAZY (A) CLEAR   Specific Gravity, Urine 1.010 1.005 - 1.030   pH 8.0 5.0 - 8.0   Glucose, UA NEGATIVE NEGATIVE mg/dL   Hgb urine dipstick NEGATIVE NEGATIVE   Bilirubin Urine NEGATIVE NEGATIVE   Ketones, ur NEGATIVE NEGATIVE mg/dL   Protein, ur NEGATIVE NEGATIVE mg/dL   Nitrite NEGATIVE NEGATIVE   Leukocytes,Ua MODERATE (A) NEGATIVE   RBC / HPF 0-5 0 - 5 RBC/hpf   WBC, UA 6-10 0 - 5 WBC/hpf   Bacteria, UA RARE (A) NONE SEEN   Squamous Epithelial / LPF 11-20 0 - 5  Amnisure  rupture of membrane (rom)not at [facility]     Status: None   Collection Time: 01/07/19  2:32 PM  Result Value Ref Range   Amnisure ROM NEGATIVE   Wet prep, genital     Status: Abnormal   Collection Time: 01/07/19  2:32 PM  Result Value Ref Range   Yeast Wet Prep HPF POC NONE SEEN NONE SEEN   Trich, Wet Prep NONE SEEN NONE SEEN   Clue Cells Wet Prep HPF POC NONE SEEN NONE SEEN   WBC, Wet Prep HPF POC MODERATE (A) NONE SEEN   Sperm NONE SEEN       MAU Course  Procedures  MDM   Assessment and Plan   1. Encounter for suspected premature rupture of membranes, with rupture of membranes not found   2. Morning sickness   3. [redacted] weeks gestation of pregnancy    DC home 3rd Trimester precautions  PTL precautions  Fetal kick counts RX: none  Return to MAU as needed FU with OB as planned  Follow-up Information    Bovard-Stuckert, Jody, MD Follow up.   Specialty:  Obstetrics and Gynecology Contact information: [address] [place] [id number] [place] DNP, CNM  01/07/19  3:18 PM

## 2019-01-07 NOTE — MAU Note (Signed)
Voided in bathroom then laid down, then 30 min later, got up and felt a gush at 11:30 and saw watery fluid with white mucus in it . Since then, feeling moist in underwear. Cramping on the way here. Baby moving well. No bleeding.

## 2019-01-08 LAB — GC/CHLAMYDIA PROBE AMP (~~LOC~~) NOT AT ARMC
Chlamydia: NEGATIVE
Neisseria Gonorrhea: NEGATIVE

## 2019-01-22 ENCOUNTER — Other Ambulatory Visit: Payer: Self-pay

## 2019-01-22 ENCOUNTER — Inpatient Hospital Stay (HOSPITAL_COMMUNITY)
Admission: AD | Admit: 2019-01-22 | Discharge: 2019-01-22 | Discharge status: Against Medical Advice | Payer: 59 | Attending: Obstetrics and Gynecology | Admitting: Obstetrics and Gynecology

## 2019-01-22 DIAGNOSIS — Z5321 Procedure and treatment not carried out due to patient leaving prior to being seen by health care provider: Secondary | ICD-10-CM | POA: Insufficient documentation

## 2019-01-22 LAB — URINALYSIS, ROUTINE W REFLEX MICROSCOPIC
Bilirubin Urine: NEGATIVE
Glucose, UA: NEGATIVE mg/dL
Hgb urine dipstick: NEGATIVE
Ketones, ur: NEGATIVE mg/dL
Leukocytes,Ua: NEGATIVE
Nitrite: NEGATIVE
Protein, ur: NEGATIVE mg/dL
Specific Gravity, Urine: 1.017 (ref 1.005–1.030)
pH: 6 (ref 5.0–8.0)

## 2019-01-22 NOTE — MAU Note (Signed)
Been having like vaginal pressure, like in between her thighs. Hurts when she sits up or sits down, or lay a certain way.  Sunday, it was like really really sore, her pelvic.  Monday, it wasn't really there when she was in the bed. Today, going back to work, there was pressure and soreness again. Was just here last wk, thought she was leaking, was just d/c- didn't have an [place], was told it was just d/c.  Wanted to make sure it is not a slow leak, notices her underwear is wet.

## 2019-01-22 NOTE — MAU Note (Signed)
NOT IN LOBBY 

## 2019-01-22 NOTE — MAU Note (Signed)
Pt called and not in lobby 

## 2019-01-22 NOTE — MAU Note (Signed)
PT callled and not in lobby

## 2019-03-01 LAB — OB RESULTS CONSOLE GBS: GBS: POSITIVE

## 2019-03-15 ENCOUNTER — Inpatient Hospital Stay (HOSPITAL_COMMUNITY)
Admission: AD | Admit: 2019-03-15 | Discharge: 2019-03-20 | DRG: 788 | Disposition: A | Discharge status: Home / Self Care | Payer: 59 | Attending: Obstetrics and Gynecology | Admitting: Obstetrics and Gynecology

## 2019-03-15 ENCOUNTER — Encounter (HOSPITAL_COMMUNITY): Payer: Self-pay | Admitting: *Deleted

## 2019-03-15 ENCOUNTER — Other Ambulatory Visit: Payer: Self-pay

## 2019-03-15 DIAGNOSIS — O1404 Mild to moderate pre-eclampsia, complicating childbirth: Principal | ICD-10-CM | POA: Diagnosis present

## 2019-03-15 DIAGNOSIS — O165 Unspecified maternal hypertension, complicating the puerperium: Secondary | ICD-10-CM | POA: Diagnosis present

## 2019-03-15 DIAGNOSIS — O99214 Obesity complicating childbirth: Secondary | ICD-10-CM | POA: Diagnosis present

## 2019-03-15 DIAGNOSIS — Z79899 Other long term (current) drug therapy: Secondary | ICD-10-CM | POA: Diagnosis not present

## 2019-03-15 DIAGNOSIS — E669 Obesity, unspecified: Secondary | ICD-10-CM | POA: Diagnosis present

## 2019-03-15 DIAGNOSIS — D649 Anemia, unspecified: Secondary | ICD-10-CM | POA: Diagnosis present

## 2019-03-15 DIAGNOSIS — O99824 Streptococcus B carrier state complicating childbirth: Secondary | ICD-10-CM | POA: Diagnosis present

## 2019-03-15 DIAGNOSIS — O1494 Unspecified pre-eclampsia, complicating childbirth: Secondary | ICD-10-CM | POA: Diagnosis present

## 2019-03-15 DIAGNOSIS — O1493 Unspecified pre-eclampsia, third trimester: Secondary | ICD-10-CM | POA: Diagnosis present

## 2019-03-15 DIAGNOSIS — N939 Abnormal uterine and vaginal bleeding, unspecified: Secondary | ICD-10-CM | POA: Diagnosis not present

## 2019-03-15 DIAGNOSIS — Z3A38 38 weeks gestation of pregnancy: Secondary | ICD-10-CM

## 2019-03-15 DIAGNOSIS — O210 Mild hyperemesis gravidarum: Secondary | ICD-10-CM

## 2019-03-15 DIAGNOSIS — O9081 Anemia of the puerperium: Secondary | ICD-10-CM | POA: Diagnosis not present

## 2019-03-15 DIAGNOSIS — Z20828 Contact with and (suspected) exposure to other viral communicable diseases: Secondary | ICD-10-CM | POA: Diagnosis present

## 2019-03-15 DIAGNOSIS — Z98891 History of uterine scar from previous surgery: Secondary | ICD-10-CM

## 2019-03-15 DIAGNOSIS — O9902 Anemia complicating childbirth: Secondary | ICD-10-CM | POA: Diagnosis present

## 2019-03-15 DIAGNOSIS — O9989 Other specified diseases and conditions complicating pregnancy, childbirth and the puerperium: Secondary | ICD-10-CM | POA: Diagnosis not present

## 2019-03-15 DIAGNOSIS — O169 Unspecified maternal hypertension, unspecified trimester: Secondary | ICD-10-CM

## 2019-03-15 LAB — CBC
HCT: 31.3 % — ABNORMAL LOW (ref 36.0–46.0)
Hemoglobin: 10.6 g/dL — ABNORMAL LOW (ref 12.0–15.0)
MCH: 29 pg (ref 26.0–34.0)
MCHC: 33.9 g/dL (ref 30.0–36.0)
MCV: 85.8 fL (ref 80.0–100.0)
Platelets: 212 10*3/uL (ref 150–400)
RBC: 3.65 MIL/uL — ABNORMAL LOW (ref 3.87–5.11)
RDW: 14.7 % (ref 11.5–15.5)
WBC: 7.5 10*3/uL (ref 4.0–10.5)
nRBC: 0 % (ref 0.0–0.2)

## 2019-03-15 LAB — COMPREHENSIVE METABOLIC PANEL
ALT: 9 U/L (ref 0–44)
AST: 21 U/L (ref 15–41)
Albumin: 2.7 g/dL — ABNORMAL LOW (ref 3.5–5.0)
Alkaline Phosphatase: 338 U/L — ABNORMAL HIGH (ref 38–126)
Anion gap: 11 (ref 5–15)
BUN: 9 mg/dL (ref 6–20)
CO2: 18 mmol/L — ABNORMAL LOW (ref 22–32)
Calcium: 8.5 mg/dL — ABNORMAL LOW (ref 8.9–10.3)
Chloride: 108 mmol/L (ref 98–111)
Creatinine, Ser: 0.85 mg/dL (ref 0.44–1.00)
GFR calc Af Amer: >60 mL/min (ref >60)
GFR calc non Af Amer: >60 mL/min (ref >60)
Glucose, Bld: 94 mg/dL (ref 70–99)
Potassium: 3.9 mmol/L (ref 3.5–5.1)
Sodium: 137 mmol/L (ref 135–145)
Total Bilirubin: 0.3 mg/dL (ref 0.3–1.2)
Total Protein: 5.8 g/dL — ABNORMAL LOW (ref 6.5–8.1)

## 2019-03-15 LAB — TYPE AND SCREEN
ABO/RH(D): B POS
Antibody Screen: NEGATIVE

## 2019-03-15 LAB — SARS CORONAVIRUS 2 BY RT PCR (HOSPITAL ORDER, PERFORMED IN ~~LOC~~ HOSPITAL LAB): SARS Coronavirus 2: NEGATIVE

## 2019-03-15 LAB — PROTEIN / CREATININE RATIO, URINE
Creatinine, Urine: 104.05 mg/dL
Protein Creatinine Ratio: 0.39 mg/mg{Cre} — ABNORMAL HIGH (ref 0.00–0.15)
Total Protein, Urine: 41 mg/dL

## 2019-03-15 LAB — LACTATE DEHYDROGENASE: LDH: 161 U/L (ref 98–192)

## 2019-03-15 LAB — URIC ACID: Uric Acid, Serum: 7.1 mg/dL (ref 2.5–7.1)

## 2019-03-15 MED ORDER — SOD CITRATE-CITRIC ACID 500-334 MG/5ML PO SOLN
30.0000 mL | ORAL | Status: DC | PRN
  Administered 2019-03-16: 30 mL via ORAL
  Filled 2019-03-15: qty 30

## 2019-03-15 MED ORDER — LACTATED RINGERS IV SOLN
INTRAVENOUS | Status: DC
  Administered 2019-03-15 – 2019-03-17 (×4): via INTRAVENOUS

## 2019-03-15 MED ORDER — LIDOCAINE HCL (PF) 1 % IJ SOLN: 30.0000 mL | INTRAMUSCULAR | Status: DC | PRN

## 2019-03-15 MED ORDER — OXYTOCIN BOLUS FROM INFUSION: 500.0000 mL | Freq: Once | INTRAVENOUS | Status: DC

## 2019-03-15 MED ORDER — ONDANSETRON HCL 4 MG/2ML IJ SOLN
4.0000 mg | Freq: Four times a day (QID) | INTRAMUSCULAR | Status: DC | PRN
  Administered 2019-03-16: 4 mg via INTRAVENOUS
  Filled 2019-03-15: qty 2

## 2019-03-15 MED ORDER — LACTATED RINGERS IV SOLN: 500.0000 mL | INTRAVENOUS | Status: DC | PRN

## 2019-03-15 MED ORDER — OXYCODONE-ACETAMINOPHEN 5-325 MG PO TABS: 2.0000 | ORAL_TABLET | ORAL | Status: DC | PRN

## 2019-03-15 MED ORDER — MISOPROSTOL 25 MCG QUARTER TABLET
25.0000 ug | ORAL_TABLET | ORAL | Status: DC | PRN
  Administered 2019-03-15 (×2): 25 ug via VAGINAL
  Filled 2019-03-15 (×2): qty 1

## 2019-03-15 MED ORDER — PENICILLIN G 3 MILLION UNITS IVPB - SIMPLE MED
3.0000 10*6.[IU] | INTRAVENOUS | Status: DC
  Administered 2019-03-16 – 2019-03-17 (×9): 3 10*6.[IU] via INTRAVENOUS
  Filled 2019-03-15 (×9): qty 100

## 2019-03-15 MED ORDER — OXYCODONE-ACETAMINOPHEN 5-325 MG PO TABS: 1.0000 | ORAL_TABLET | ORAL | Status: DC | PRN

## 2019-03-15 MED ORDER — TERBUTALINE SULFATE 1 MG/ML IJ SOLN: 0.2500 mg | Freq: Once | INTRAMUSCULAR | Status: DC | PRN

## 2019-03-15 MED ORDER — SODIUM CHLORIDE 0.9 % IV SOLN
5.0000 10*6.[IU] | Freq: Once | INTRAVENOUS | Status: AC
  Administered 2019-03-15: 5 10*6.[IU] via INTRAVENOUS
  Filled 2019-03-15: qty 5

## 2019-03-15 MED ORDER — OXYTOCIN 40 UNITS IN NORMAL SALINE INFUSION - SIMPLE MED
2.5000 [IU]/h | INTRAVENOUS | Status: DC
  Filled 2019-03-15: qty 1000

## 2019-03-15 MED ORDER — ACETAMINOPHEN 325 MG PO TABS: 650.0000 mg | ORAL_TABLET | ORAL | Status: DC | PRN

## 2019-03-15 NOTE — Plan of Care (Signed)

## 2019-03-15 NOTE — H&P (Signed)
Kathryn FredericksonJaniyah Clark is a 18 y.o.prime female presenting for iol due to preE. Pt has had increasing BPs over the past few weeks. She had bloodwork done with also noted proteinuria. She saw Dr Jackelyn Knifemeisinger who sent her to the hospital for iol Pt is dated per LMP which was confirmed with a 9week US. Her pregnancy has been otherwise uncomplicated.She is GBS positive. Had neg carrier screening. She declined genetic screening and flu shot. Covid negative.  OB History    Gravida  1   Para      Term      Preterm      AB      Living        SAB      TAB      Ectopic      Multiple      Live Births             History reviewed. No pertinent past medical history. History reviewed. No pertinent surgical history. Family History: family history includes Healthy in her father and mother. Social History:  reports that she has never smoked. She has never used smokeless tobacco. She reports that she does not drink alcohol or use drugs.     Maternal Diabetes: No Genetic Screening: Declined Maternal Ultrasounds/Referrals: Normal Fetal Ultrasounds or other Referrals:  None Maternal Substance Abuse:  No Significant Maternal Medications:  None Significant Maternal Lab Results:  Group B Strep positive Other Comments:  None  Review of Systems  Constitutional: Negative for chills, fever, malaise/fatigue and weight loss.  Eyes: Negative for blurred vision and double vision.  Respiratory: Negative for shortness of breath.   Cardiovascular: Negative for chest pain, palpitations and leg swelling.  Gastrointestinal: Negative for abdominal pain, heartburn, nausea and vomiting.  Genitourinary: Negative for dysuria.  Musculoskeletal: Negative for myalgias.  Skin: Negative for itching and rash.  Neurological: Negative for dizziness and headaches.  Endo/Heme/Allergies: Negative for environmental allergies. Does not bruise/bleed easily.  Psychiatric/Behavioral: Negative for depression, hallucinations,  substance abuse and suicidal ideas. The patient is not nervous/anxious.    Maternal Medical History:  Reason for admission: Nausea. preE  Contractions: Frequency: rare and irregular.   Perceived severity is mild.    Fetal activity: Perceived fetal activity is normal.   Last perceived fetal movement was within the past hour.    Prenatal complications: Pre-eclampsia.   Prenatal Complications - Diabetes: none.    Dilation: 1 Effacement (%): 50 Station: -2 Exam by:: H. Krietemeyer RNC Blood pressure 131/90, pulse (!) 101, temperature 98 F (36.7 C), temperature source Oral, resp. rate 18, height 5\' 4"  (1.626 m), weight 89.8 kg, last menstrual period 06/20/2018. Maternal Exam:  Uterine Assessment: Contraction strength is mild.  Contraction frequency is rare and irregular.   Abdomen: Patient reports generalized tenderness.  Estimated fetal weight is AGA.   Fetal presentation: vertex  Introitus: Normal vulva. Vulva is negative for condylomata and lesion.  Normal vagina.  Vagina is negative for condylomata.  Pelvis: adequate for delivery.   Cervix: Cervix evaluated by digital exam.     Fetal Exam Fetal Monitor Review: Baseline rate: 145.  Variability: moderate (6-25 bpm).   Pattern: accelerations present and no decelerations.    Fetal State Assessment: Category I - tracings are normal.     Physical Exam  Constitutional: She is oriented to person, place, and time. She appears well-developed and well-nourished.  Neck: Normal range of motion.  Cardiovascular: Normal rate.  Respiratory: Effort normal.  GI: Soft. There is generalized  abdominal tenderness.  Genitourinary:    Vulva, vagina and uterus normal.     No vulval condylomata or lesion noted.   Musculoskeletal: Normal range of motion.  Neurological: She is alert and oriented to person, place, and time.  Skin: Skin is warm.  Psychiatric: She has a normal mood and affect. Her behavior is normal. Judgment and thought  content normal.    Prenatal labs: ABO, Rh: --/--/B POS (08/14 1718) Antibody: NEG (08/14 1718) Rubella: Immune (01/21 0000) RPR:    HBsAg: Negative (01/21 0000)  HIV: Non-reactive (01/21 0000)  GBS: Positive (07/31 0000)   Assessment/Plan: Prime at 38 2/7wks with preE for IOL  BP initially elevated but now stable- monitor Labs ordered Cytotec placed - was 1cm dilated AROM/PIT as needed Pain control prn Anticipate svd   Venetia Night  03/15/2019, 7:03 PM

## 2019-03-16 ENCOUNTER — Inpatient Hospital Stay (HOSPITAL_COMMUNITY): Payer: 59 | Admitting: Anesthesiology

## 2019-03-16 LAB — CBC
HCT: 31.9 % — ABNORMAL LOW (ref 36.0–46.0)
Hemoglobin: 10.7 g/dL — ABNORMAL LOW (ref 12.0–15.0)
MCH: 28.8 pg (ref 26.0–34.0)
MCHC: 33.5 g/dL (ref 30.0–36.0)
MCV: 85.8 fL (ref 80.0–100.0)
Platelets: 171 10*3/uL (ref 150–400)
RBC: 3.72 MIL/uL — ABNORMAL LOW (ref 3.87–5.11)
RDW: 14.6 % (ref 11.5–15.5)
WBC: 9.7 10*3/uL (ref 4.0–10.5)
nRBC: 0 % (ref 0.0–0.2)

## 2019-03-16 LAB — ABO/RH: ABO/RH(D): B POS

## 2019-03-16 LAB — RPR: RPR Ser Ql: NONREACTIVE

## 2019-03-16 MED ORDER — MISOPROSTOL 50MCG HALF TABLET
50.0000 ug | ORAL_TABLET | Freq: Once | ORAL | Status: AC
  Administered 2019-03-16: 50 ug via ORAL

## 2019-03-16 MED ORDER — SODIUM CHLORIDE (PF) 0.9 % IJ SOLN
INTRAMUSCULAR | Status: DC | PRN
  Administered 2019-03-16: 12 mL/h via EPIDURAL

## 2019-03-16 MED ORDER — LIDOCAINE HCL (PF) 1 % IJ SOLN
INTRAMUSCULAR | Status: DC | PRN
  Administered 2019-03-16: 11 mL via EPIDURAL

## 2019-03-16 MED ORDER — PHENYLEPHRINE 40 MCG/ML (10ML) SYRINGE FOR IV PUSH (FOR BLOOD PRESSURE SUPPORT): 80.0000 ug | PREFILLED_SYRINGE | INTRAVENOUS | Status: DC | PRN

## 2019-03-16 MED ORDER — DIPHENHYDRAMINE HCL 50 MG/ML IJ SOLN: 12.5000 mg | INTRAMUSCULAR | Status: DC | PRN

## 2019-03-16 MED ORDER — EPHEDRINE 5 MG/ML INJ: 10.0000 mg | INTRAVENOUS | Status: DC | PRN

## 2019-03-16 MED ORDER — FENTANYL-BUPIVACAINE-NACL 0.5-0.125-0.9 MG/250ML-% EP SOLN
12.0000 mL/h | EPIDURAL | Status: DC | PRN
  Filled 2019-03-16: qty 250

## 2019-03-16 MED ORDER — OXYTOCIN 40 UNITS IN NORMAL SALINE INFUSION - SIMPLE MED
1.0000 m[IU]/min | INTRAVENOUS | Status: DC
  Administered 2019-03-16: 2 m[IU]/min via INTRAVENOUS
  Administered 2019-03-16: 14 m[IU]/min via INTRAVENOUS
  Filled 2019-03-16: qty 1000

## 2019-03-16 MED ORDER — LACTATED RINGERS IV SOLN
500.0000 mL | Freq: Once | INTRAVENOUS | Status: AC
  Administered 2019-03-16: 500 mL via INTRAVENOUS

## 2019-03-16 MED ORDER — PHENYLEPHRINE 40 MCG/ML (10ML) SYRINGE FOR IV PUSH (FOR BLOOD PRESSURE SUPPORT)
80.0000 ug | PREFILLED_SYRINGE | INTRAVENOUS | Status: DC | PRN
  Filled 2019-03-16: qty 10

## 2019-03-16 MED ORDER — BUTORPHANOL TARTRATE 1 MG/ML IJ SOLN
1.0000 mg | INTRAMUSCULAR | Status: DC | PRN
  Administered 2019-03-16: 1 mg via INTRAVENOUS
  Filled 2019-03-16 (×2): qty 1

## 2019-03-16 MED ORDER — MISOPROSTOL 50MCG HALF TABLET
ORAL_TABLET | ORAL | Status: AC
  Filled 2019-03-16: qty 1

## 2019-03-16 MED ORDER — TERBUTALINE SULFATE 1 MG/ML IJ SOLN: 0.2500 mg | Freq: Once | INTRAMUSCULAR | Status: DC | PRN

## 2019-03-16 NOTE — Progress Notes (Signed)
Patient ID: Kathryn Clark, female   DOB: [birth year], 18 y.o.   MRN: [id number] Foley bulb came out Pt reports appreciating some contractions now Cat 1, 135 SVE 4-5/70/-2  Plan: AROM performed with clear amniotic fluid noted          Continue with expectant mgmt; pitocin per protocol          Pain control per pt request

## 2019-03-16 NOTE — Progress Notes (Signed)
Patient ID: [name], female   DOB: 02-23-2001, 18 y.o.   MRN: [id number] Pt reports pain with contractions - had a dose fo stadol earlier; tolerating pain well at this time. +FMs VS  137-158/88-96, 111 EFM - 145, cat 1 TOCO - ctxs q 4-54mins SVE - 5/80/-1  A/P: Prime progressing into active labor on 1mvus of pitocin         IUPC placed         Discussed considering epidural for pain relief/ may also help BP         Anticipate svd

## 2019-03-16 NOTE — Anesthesia Preprocedure Evaluation (Addendum)
Anesthesia Evaluation  Patient identified by MRN, date of birth, ID band Patient awake    Reviewed: Allergy & Precautions, NPO status , Patient's Chart, lab work & pertinent test results  Airway Mallampati: II  TM Distance: >3 FB Neck ROM: Full    Dental no notable dental hx. (+) Teeth Intact   Pulmonary neg pulmonary ROS,    Pulmonary exam normal breath sounds clear to auscultation       Cardiovascular hypertension, Normal cardiovascular exam Rhythm:Regular Rate:Normal     Neuro/Psych negative neurological ROS  negative psych ROS   GI/Hepatic negative GI ROS, Neg liver ROS,   Endo/Other  Obesity  Renal/GU negative Renal ROS  negative genitourinary   Musculoskeletal negative musculoskeletal ROS (+)   Abdominal (+) + obese,   Peds negative pediatric ROS (+)  Hematology  (+) anemia ,   Anesthesia Other Findings   Reproductive/Obstetrics (+) Pregnancy Pre eclampsia,failure to progress,patient for C/Section.                           Anesthesia Physical Anesthesia Plan  ASA: II and emergent  Anesthesia Plan: Epidural   Post-op Pain Management:    Induction:   PONV Risk Score and Plan: 4 or greater and Ondansetron, Dexamethasone, Scopolamine patch - Pre-op and Treatment may vary due to age or medical condition  Airway Management Planned: Natural Airway  Additional Equipment:   Intra-op Plan:   Post-operative Plan:   Informed Consent: I have reviewed the patients History and Physical, chart, labs and discussed the procedure including the risks, benefits and alternatives for the proposed anesthesia with the patient or authorized representative who has indicated his/her understanding and acceptance.     Dental advisory given  Plan Discussed with: CRNA and Surgeon  Anesthesia Plan Comments: (Will use epidural for C/Section. M. Royce Macadamia, MD)       Anesthesia Quick  Evaluation

## 2019-03-16 NOTE — Plan of Care (Signed)
  Problem: Education: Goal: Knowledge of Childbirth will improve Outcome: Progressing Goal: Ability to make informed decisions regarding treatment and plan of care will improve Outcome: Progressing Goal: Ability to state and carry out methods to decrease the pain will improve Outcome: Progressing Goal: Individualized Educational Video(s) Outcome: Progressing   Problem: Coping: Goal: Ability to verbalize concerns and feelings about labor and delivery will improve Outcome: Progressing   Problem: Life Cycle: Goal: Ability to make normal progression through stages of labor will improve Outcome: Progressing Goal: Ability to effectively push during vaginal delivery will improve Outcome: Progressing   Problem: Role Relationship: Goal: Will demonstrate positive interactions with the child Outcome: Progressing   Problem: Safety: Goal: Risk of complications during labor and delivery will decrease Outcome: Progressing   Problem: Health Behavior/Discharge Planning: Goal: Ability to manage health-related needs will improve Outcome: Progressing   Problem: Clinical Measurements: Goal: Ability to maintain clinical measurements within normal limits will improve Outcome: Progressing Goal: Will remain free from infection Outcome: Progressing Goal: Diagnostic test results will improve Outcome: Progressing Goal: Respiratory complications will improve Outcome: Progressing Goal: Cardiovascular complication will be avoided Outcome: Progressing   Problem: Activity: Goal: Risk for activity intolerance will decrease Outcome: Progressing   Problem: Nutrition: Goal: Adequate nutrition will be maintained Outcome: Progressing   Problem: Elimination: Goal: Will not experience complications related to bowel motility Outcome: Progressing Goal: Will not experience complications related to urinary retention Outcome: Progressing   Problem: Skin Integrity: Goal: Risk for impaired skin integrity  will decrease Outcome: Progressing

## 2019-03-16 NOTE — Anesthesia Procedure Notes (Signed)
Epidural Patient location during procedure: OB Start time: 03/16/2019 7:37 PM End time: 03/16/2019 7:51 PM  Staffing Anesthesiologist: Lynda Rainwater, MD Performed: anesthesiologist   Preanesthetic Checklist Completed: patient identified, site marked, surgical consent, pre-op evaluation, timeout performed, IV checked, risks and benefits discussed and monitors and equipment checked  Epidural Patient position: sitting Prep: ChloraPrep Patient monitoring: heart rate, cardiac monitor, continuous pulse ox and blood pressure Approach: midline Location: L2-L3 Injection technique: LOR saline  Needle:  Needle type: Tuohy  Needle gauge: 17 G Needle length: 9 cm Needle insertion depth: 6 cm Catheter type: closed end flexible Catheter size: 20 Guage Catheter at skin depth: 10 cm Test dose: negative  Assessment Events: blood not aspirated, injection not painful, no injection resistance, negative IV test and no paresthesia  Additional Notes Reason for block:procedure for pain

## 2019-03-16 NOTE — Progress Notes (Signed)
Patient ID: Kathryn Clark, female   DOB: [birth year], 18 y.o.   MRN: [id number] Pt reports no painful contractions. +Fms. No LOF, HA or blurry vision VS: 143/97 ( 128-142/80-105), 85 GEN - NAD EFM - cat 1 TOCO - rare contraction SVE - 1.5/70/-2  A/P: Prime at 67 3/7wks with preE; asymptomatic at this time         S/P cytotec vaginally x 2 and orally x 1 and now on pitocin         Foley catheter placed with 60cc saline         Continue pitocin per protocol         Expectant mgmt; monotor BP and treat accordingly

## 2019-03-17 ENCOUNTER — Encounter (HOSPITAL_COMMUNITY)
Admission: AD | Disposition: A | Discharge status: Home / Self Care | Payer: Self-pay | Source: Home / Self Care | Attending: Obstetrics and Gynecology

## 2019-03-17 ENCOUNTER — Encounter (HOSPITAL_COMMUNITY): Payer: Self-pay | Admitting: Obstetrics and Gynecology

## 2019-03-17 DIAGNOSIS — Z98891 History of uterine scar from previous surgery: Secondary | ICD-10-CM

## 2019-03-17 HISTORY — DX: History of uterine scar from previous surgery: Z98.891

## 2019-03-17 LAB — CBC
HCT: 30.8 % — ABNORMAL LOW (ref 36.0–46.0)
Hemoglobin: 10.4 g/dL — ABNORMAL LOW (ref 12.0–15.0)
MCH: 29.2 pg (ref 26.0–34.0)
MCHC: 33.8 g/dL (ref 30.0–36.0)
MCV: 86.5 fL (ref 80.0–100.0)
Platelets: 192 10*3/uL (ref 150–400)
RBC: 3.56 MIL/uL — ABNORMAL LOW (ref 3.87–5.11)
RDW: 15.1 % (ref 11.5–15.5)
WBC: 17.3 10*3/uL — ABNORMAL HIGH (ref 4.0–10.5)
nRBC: 0 % (ref 0.0–0.2)

## 2019-03-17 SURGERY — Surgical Case
Anesthesia: Epidural | Site: Abdomen | Wound class: Clean Contaminated

## 2019-03-17 MED ORDER — LACTATED RINGERS IV SOLN
INTRAVENOUS | Status: DC
  Administered 2019-03-18: 12:00:00 via INTRAVENOUS

## 2019-03-17 MED ORDER — DIPHENHYDRAMINE HCL 25 MG PO CAPS: 25.0000 mg | ORAL_CAPSULE | Freq: Four times a day (QID) | ORAL | Status: DC | PRN

## 2019-03-17 MED ORDER — HYDRALAZINE HCL 20 MG/ML IJ SOLN: 10.0000 mg | INTRAMUSCULAR | Status: DC | PRN

## 2019-03-17 MED ORDER — LABETALOL HCL 5 MG/ML IV SOLN
INTRAVENOUS | Status: DC | PRN
  Administered 2019-03-17 (×3): 10 mg via INTRAVENOUS

## 2019-03-17 MED ORDER — NALOXONE HCL 0.4 MG/ML IJ SOLN: 0.4000 mg | INTRAMUSCULAR | Status: DC | PRN

## 2019-03-17 MED ORDER — SIMETHICONE 80 MG PO CHEW: 80.0000 mg | CHEWABLE_TABLET | ORAL | Status: DC | PRN

## 2019-03-17 MED ORDER — OXYCODONE HCL 5 MG PO TABS
5.0000 mg | ORAL_TABLET | ORAL | Status: DC | PRN
  Administered 2019-03-18 (×3): 10 mg via ORAL
  Filled 2019-03-17 (×3): qty 2

## 2019-03-17 MED ORDER — MAGNESIUM SULFATE 40 G IN LACTATED RINGERS - SIMPLE
INTRAVENOUS | Status: AC
  Filled 2019-03-17: qty 500

## 2019-03-17 MED ORDER — ONDANSETRON HCL 4 MG/2ML IJ SOLN: 4.0000 mg | Freq: Three times a day (TID) | INTRAMUSCULAR | Status: DC | PRN

## 2019-03-17 MED ORDER — SENNOSIDES-DOCUSATE SODIUM 8.6-50 MG PO TABS
2.0000 | ORAL_TABLET | ORAL | Status: DC
  Administered 2019-03-18 – 2019-03-20 (×3): 2 via ORAL
  Filled 2019-03-17 (×3): qty 2

## 2019-03-17 MED ORDER — COCONUT OIL OIL: 1.0000 "application " | TOPICAL_OIL | Status: DC | PRN

## 2019-03-17 MED ORDER — MEPERIDINE HCL 25 MG/ML IJ SOLN: 6.2500 mg | INTRAMUSCULAR | Status: DC | PRN

## 2019-03-17 MED ORDER — NALBUPHINE HCL 10 MG/ML IJ SOLN: 5.0000 mg | INTRAMUSCULAR | Status: DC | PRN

## 2019-03-17 MED ORDER — PHENYLEPHRINE HCL-NACL 20-0.9 MG/250ML-% IV SOLN
INTRAVENOUS | Status: AC
  Filled 2019-03-17: qty 250

## 2019-03-17 MED ORDER — MENTHOL 3 MG MT LOZG: 1.0000 | LOZENGE | OROMUCOSAL | Status: DC | PRN

## 2019-03-17 MED ORDER — ZOLPIDEM TARTRATE 5 MG PO TABS: 5.0000 mg | ORAL_TABLET | Freq: Every evening | ORAL | Status: DC | PRN

## 2019-03-17 MED ORDER — WITCH HAZEL-GLYCERIN EX PADS: 1.0000 "application " | MEDICATED_PAD | CUTANEOUS | Status: DC | PRN

## 2019-03-17 MED ORDER — SODIUM CHLORIDE 0.9% FLUSH: 3.0000 mL | INTRAVENOUS | Status: DC | PRN

## 2019-03-17 MED ORDER — KETAMINE HCL 50 MG/5ML IJ SOSY
PREFILLED_SYRINGE | INTRAMUSCULAR | Status: AC
  Filled 2019-03-17: qty 5

## 2019-03-17 MED ORDER — MAGNESIUM SULFATE 40 G IN LACTATED RINGERS - SIMPLE
2.0000 g/h | INTRAVENOUS | Status: AC
  Administered 2019-03-18: 2 g/h via INTRAVENOUS
  Filled 2019-03-17: qty 500

## 2019-03-17 MED ORDER — NALOXONE HCL 4 MG/10ML IJ SOLN
1.0000 ug/kg/h | INTRAVENOUS | Status: DC | PRN
  Filled 2019-03-17: qty 5

## 2019-03-17 MED ORDER — BUPIVACAINE HCL (PF) 0.25 % IJ SOLN
INTRAMUSCULAR | Status: AC
  Filled 2019-03-17: qty 20

## 2019-03-17 MED ORDER — DIBUCAINE (PERIANAL) 1 % EX OINT: 1.0000 "application " | TOPICAL_OINTMENT | CUTANEOUS | Status: DC | PRN

## 2019-03-17 MED ORDER — LABETALOL HCL 5 MG/ML IV SOLN: 80.0000 mg | INTRAVENOUS | Status: DC | PRN

## 2019-03-17 MED ORDER — FENTANYL CITRATE (PF) 100 MCG/2ML IJ SOLN
INTRAMUSCULAR | Status: AC
  Filled 2019-03-17: qty 2

## 2019-03-17 MED ORDER — NALBUPHINE HCL 10 MG/ML IJ SOLN: 5.0000 mg | Freq: Once | INTRAMUSCULAR | Status: DC | PRN

## 2019-03-17 MED ORDER — ONDANSETRON HCL 4 MG/2ML IJ SOLN
INTRAMUSCULAR | Status: AC
  Filled 2019-03-17: qty 2

## 2019-03-17 MED ORDER — SCOPOLAMINE 1 MG/3DAYS TD PT72: 1.0000 | MEDICATED_PATCH | TRANSDERMAL | Status: DC

## 2019-03-17 MED ORDER — LABETALOL HCL 5 MG/ML IV SOLN
20.0000 mg | INTRAVENOUS | Status: DC | PRN
  Administered 2019-03-17: 20 mg via INTRAVENOUS
  Filled 2019-03-17: qty 4

## 2019-03-17 MED ORDER — ACETAMINOPHEN 325 MG PO TABS
650.0000 mg | ORAL_TABLET | ORAL | Status: DC | PRN
  Administered 2019-03-17 – 2019-03-19 (×7): 650 mg via ORAL
  Filled 2019-03-17 (×6): qty 2

## 2019-03-17 MED ORDER — MORPHINE SULFATE (PF) 0.5 MG/ML IJ SOLN
INTRAMUSCULAR | Status: DC | PRN
  Administered 2019-03-17: 3 mg via EPIDURAL

## 2019-03-17 MED ORDER — LABETALOL HCL 5 MG/ML IV SOLN
INTRAVENOUS | Status: AC
  Filled 2019-03-17: qty 4

## 2019-03-17 MED ORDER — LABETALOL HCL 5 MG/ML IV SOLN: 20.0000 mg | INTRAVENOUS | Status: DC | PRN

## 2019-03-17 MED ORDER — LIDOCAINE-EPINEPHRINE (PF) 2 %-1:200000 IJ SOLN
INTRAMUSCULAR | Status: AC
  Filled 2019-03-17: qty 10

## 2019-03-17 MED ORDER — FENTANYL CITRATE (PF) 100 MCG/2ML IJ SOLN
25.0000 ug | INTRAMUSCULAR | Status: DC | PRN
  Administered 2019-03-17: 50 ug via INTRAVENOUS

## 2019-03-17 MED ORDER — DEXAMETHASONE SODIUM PHOSPHATE 10 MG/ML IJ SOLN
INTRAMUSCULAR | Status: AC
  Filled 2019-03-17: qty 1

## 2019-03-17 MED ORDER — SODIUM CHLORIDE 0.9 % IV SOLN
INTRAVENOUS | Status: DC | PRN
  Administered 2019-03-17: 16:00:00 40 [IU] via INTRAVENOUS

## 2019-03-17 MED ORDER — MORPHINE SULFATE (PF) 0.5 MG/ML IJ SOLN
INTRAMUSCULAR | Status: AC
  Filled 2019-03-17: qty 10

## 2019-03-17 MED ORDER — MIDAZOLAM HCL 2 MG/2ML IJ SOLN
INTRAMUSCULAR | Status: AC
  Filled 2019-03-17: qty 2

## 2019-03-17 MED ORDER — METOCLOPRAMIDE HCL 5 MG/ML IJ SOLN
INTRAMUSCULAR | Status: DC | PRN
  Administered 2019-03-17 (×2): 5 mg via INTRAVENOUS

## 2019-03-17 MED ORDER — PRENATAL MULTIVITAMIN CH
1.0000 | ORAL_TABLET | Freq: Every day | ORAL | Status: DC
  Administered 2019-03-18 – 2019-03-20 (×3): 1 via ORAL
  Filled 2019-03-17 (×3): qty 1

## 2019-03-17 MED ORDER — LABETALOL HCL 5 MG/ML IV SOLN
40.0000 mg | INTRAVENOUS | Status: DC | PRN
  Administered 2019-03-17: 40 mg via INTRAVENOUS
  Filled 2019-03-17: qty 8

## 2019-03-17 MED ORDER — CEFAZOLIN SODIUM-DEXTROSE 2-4 GM/100ML-% IV SOLN
INTRAVENOUS | Status: AC
  Filled 2019-03-17: qty 100

## 2019-03-17 MED ORDER — MAGNESIUM SULFATE BOLUS VIA INFUSION
4.0000 g | Freq: Once | INTRAVENOUS | Status: AC
  Administered 2019-03-17: 4 g via INTRAVENOUS
  Filled 2019-03-17: qty 500

## 2019-03-17 MED ORDER — GABAPENTIN 100 MG PO CAPS
100.0000 mg | ORAL_CAPSULE | Freq: Two times a day (BID) | ORAL | Status: DC
  Administered 2019-03-17 – 2019-03-20 (×6): 100 mg via ORAL
  Filled 2019-03-17 (×6): qty 1

## 2019-03-17 MED ORDER — SIMETHICONE 80 MG PO CHEW
80.0000 mg | CHEWABLE_TABLET | Freq: Three times a day (TID) | ORAL | Status: DC
  Administered 2019-03-18 – 2019-03-20 (×7): 80 mg via ORAL
  Filled 2019-03-17 (×7): qty 1

## 2019-03-17 MED ORDER — OXYTOCIN 40 UNITS IN NORMAL SALINE INFUSION - SIMPLE MED
INTRAVENOUS | Status: AC
  Filled 2019-03-17: qty 1000

## 2019-03-17 MED ORDER — SODIUM BICARBONATE 8.4 % IV SOLN
INTRAVENOUS | Status: DC | PRN
  Administered 2019-03-17: 4 mL via EPIDURAL
  Administered 2019-03-17: 3 mL via EPIDURAL
  Administered 2019-03-17 (×2): 5 mL via EPIDURAL
  Administered 2019-03-17: 3 mL via EPIDURAL

## 2019-03-17 MED ORDER — SODIUM CHLORIDE 0.9 % IV SOLN
INTRAVENOUS | Status: DC | PRN
  Administered 2019-03-17: 16:00:00 via INTRAVENOUS

## 2019-03-17 MED ORDER — METOCLOPRAMIDE HCL 5 MG/ML IJ SOLN
INTRAMUSCULAR | Status: AC
  Filled 2019-03-17: qty 2

## 2019-03-17 MED ORDER — FENTANYL CITRATE (PF) 250 MCG/5ML IJ SOLN
INTRAMUSCULAR | Status: DC | PRN
  Administered 2019-03-17: 100 ug via EPIDURAL

## 2019-03-17 MED ORDER — BUPIVACAINE HCL (PF) 0.25 % IJ SOLN
INTRAMUSCULAR | Status: DC | PRN
  Administered 2019-03-17: 20 mL

## 2019-03-17 MED ORDER — DIPHENHYDRAMINE HCL 50 MG/ML IJ SOLN: 12.5000 mg | INTRAMUSCULAR | Status: DC | PRN

## 2019-03-17 MED ORDER — CEFAZOLIN SODIUM-DEXTROSE 2-4 GM/100ML-% IV SOLN: 2.0000 g | Freq: Once | INTRAVENOUS | Status: DC

## 2019-03-17 MED ORDER — LACTATED RINGERS IV SOLN
INTRAVENOUS | Status: DC | PRN
  Administered 2019-03-17 (×2): via INTRAVENOUS

## 2019-03-17 MED ORDER — LABETALOL HCL 100 MG PO TABS
100.0000 mg | ORAL_TABLET | Freq: Two times a day (BID) | ORAL | Status: DC
  Administered 2019-03-17 – 2019-03-20 (×6): 100 mg via ORAL
  Filled 2019-03-17 (×6): qty 1

## 2019-03-17 MED ORDER — TETANUS-DIPHTH-ACELL PERTUSSIS 5-2.5-18.5 LF-MCG/0.5 IM SUSP: 0.5000 mL | Freq: Once | INTRAMUSCULAR | Status: DC

## 2019-03-17 MED ORDER — HYDRALAZINE HCL 20 MG/ML IJ SOLN: 5.0000 mg | INTRAMUSCULAR | Status: DC | PRN

## 2019-03-17 MED ORDER — ONDANSETRON HCL 4 MG/2ML IJ SOLN
INTRAMUSCULAR | Status: DC | PRN
  Administered 2019-03-17: 4 mg via INTRAVENOUS

## 2019-03-17 MED ORDER — KETAMINE HCL 10 MG/ML IJ SOLN
INTRAMUSCULAR | Status: DC | PRN
  Administered 2019-03-17: 20 mg via INTRAVENOUS
  Administered 2019-03-17: 10 mg via INTRAVENOUS
  Administered 2019-03-17: 20 mg via INTRAVENOUS

## 2019-03-17 MED ORDER — LABETALOL HCL 5 MG/ML IV SOLN: 40.0000 mg | INTRAVENOUS | Status: DC | PRN

## 2019-03-17 MED ORDER — SIMETHICONE 80 MG PO CHEW
80.0000 mg | CHEWABLE_TABLET | ORAL | Status: DC
  Administered 2019-03-18 – 2019-03-20 (×3): 80 mg via ORAL
  Filled 2019-03-17 (×3): qty 1

## 2019-03-17 MED ORDER — SCOPOLAMINE 1 MG/3DAYS TD PT72
MEDICATED_PATCH | TRANSDERMAL | Status: DC | PRN
  Administered 2019-03-17: 1 via TRANSDERMAL

## 2019-03-17 MED ORDER — MIDAZOLAM HCL 2 MG/2ML IJ SOLN
INTRAMUSCULAR | Status: DC | PRN
  Administered 2019-03-17: 2 mg via INTRAVENOUS

## 2019-03-17 MED ORDER — OXYTOCIN 40 UNITS IN NORMAL SALINE INFUSION - SIMPLE MED
2.5000 [IU]/h | INTRAVENOUS | Status: AC
  Administered 2019-03-17: 2.5 [IU]/h via INTRAVENOUS
  Filled 2019-03-17: qty 1000

## 2019-03-17 MED ORDER — HYDRALAZINE HCL 20 MG/ML IJ SOLN
INTRAMUSCULAR | Status: DC | PRN
  Administered 2019-03-17 (×2): 4 mg via INTRAVENOUS

## 2019-03-17 MED ORDER — DIPHENHYDRAMINE HCL 25 MG PO CAPS: 25.0000 mg | ORAL_CAPSULE | ORAL | Status: DC | PRN

## 2019-03-17 SURGICAL SUPPLY — 37 items
BENZOIN TINCTURE PRP APPL 2/3 (GAUZE/BANDAGES/DRESSINGS) ×3 IMPLANT
CHLORAPREP W/TINT 26ML (MISCELLANEOUS) ×3 IMPLANT
CLAMP CORD UMBIL (MISCELLANEOUS) IMPLANT
CLOSURE WOUND 1/2 X4 (GAUZE/BANDAGES/DRESSINGS) ×1
CLOTH BEACON ORANGE TIMEOUT ST (SAFETY) ×3 IMPLANT
DRAPE C SECTION CLR SCREEN (DRAPES) ×3 IMPLANT
DRSG OPSITE POSTOP 4X10 (GAUZE/BANDAGES/DRESSINGS) ×3 IMPLANT
ELECT REM PT RETURN 9FT ADLT (ELECTROSURGICAL) ×3
ELECTRODE REM PT RTRN 9FT ADLT (ELECTROSURGICAL) ×1 IMPLANT
EXTRACTOR VACUUM KIWI (MISCELLANEOUS) IMPLANT
GLOVE BIO SURGEON STRL SZ 6.5 (GLOVE) ×2 IMPLANT
GLOVE BIO SURGEONS STRL SZ 6.5 (GLOVE) ×1
GLOVE BIOGEL PI IND STRL 7.0 (GLOVE) ×2 IMPLANT
GLOVE BIOGEL PI INDICATOR 7.0 (GLOVE) ×4
GOWN STRL REUS W/TWL LRG LVL3 (GOWN DISPOSABLE) ×6 IMPLANT
KIT ABG SYR 3ML LUER SLIP (SYRINGE) IMPLANT
NEEDLE HYPO 25X5/8 SAFETYGLIDE (NEEDLE) IMPLANT
NS IRRIG 1000ML POUR BTL (IV SOLUTION) ×3 IMPLANT
PACK C SECTION WH (CUSTOM PROCEDURE TRAY) ×3 IMPLANT
PAD OB MATERNITY 4.3X12.25 (PERSONAL CARE ITEMS) ×3 IMPLANT
RETRACTOR WND ALEXIS 25 LRG (MISCELLANEOUS) ×1 IMPLANT
RTRCTR C-SECT PINK 25CM LRG (MISCELLANEOUS) IMPLANT
RTRCTR WOUND ALEXIS 25CM LRG (MISCELLANEOUS) ×3
STRIP CLOSURE SKIN 1/2X4 (GAUZE/BANDAGES/DRESSINGS) ×2 IMPLANT
SUT CHROMIC 1 CTX 36 (SUTURE) ×6 IMPLANT
SUT PLAIN 0 NONE (SUTURE) IMPLANT
SUT PLAIN 2 0 XLH (SUTURE) ×3 IMPLANT
SUT VIC AB 0 CT1 27 (SUTURE) ×4
SUT VIC AB 0 CT1 27XBRD ANBCTR (SUTURE) ×2 IMPLANT
SUT VIC AB 2-0 CT1 27 (SUTURE) ×2
SUT VIC AB 2-0 CT1 TAPERPNT 27 (SUTURE) ×1 IMPLANT
SUT VIC AB 3-0 CT1 27 (SUTURE)
SUT VIC AB 3-0 CT1 TAPERPNT 27 (SUTURE) IMPLANT
SUT VIC AB 4-0 KS 27 (SUTURE) ×3 IMPLANT
TOWEL OR 17X24 6PK STRL BLUE (TOWEL DISPOSABLE) ×3 IMPLANT
TRAY FOLEY W/BAG SLVR 14FR LF (SET/KITS/TRAYS/PACK) ×3 IMPLANT
WATER STERILE IRR 1000ML POUR (IV SOLUTION) ×3 IMPLANT

## 2019-03-17 NOTE — Transfer of Care (Signed)
Immediate Anesthesia Transfer of Care Note  Patient: Kathryn Clark  Procedure(s) Performed: CESAREAN SECTION (N/A Abdomen)  Patient Location: PACU  Anesthesia Type:Epidural  Level of Consciousness: awake, drowsy and patient cooperative  Airway & Oxygen Therapy: Patient Spontanous Breathing  Post-op Assessment: Report given to RN and Post -op Vital signs reviewed and stable  Post vital signs: Reviewed and stable  Last Vitals:  Vitals Value Taken Time  BP 123/90 03/17/19 1618  Temp    Pulse 96 03/17/19 1624  Resp 15 03/17/19 1624  SpO2 100 % 03/17/19 1624  Vitals shown include unvalidated device data.  Last Pain:  Vitals:   03/17/19 1230  TempSrc: Oral  PainSc:          Complications: No apparent anesthesia complications

## 2019-03-17 NOTE — Progress Notes (Signed)
Patient ID: Kathryn Clark, female   DOB: 2000-08-12, 18 y.o.   MRN: [id number] Notified another stat c/s added to OR schedule. Agree that it takes precedent over pt. Will continue with our current plan Pt stable

## 2019-03-17 NOTE — Progress Notes (Signed)
MD discussed c-section with patient.

## 2019-03-17 NOTE — Progress Notes (Signed)
Patient ID: Kathryn Clark, female   DOB: August 26, 2000, 18 y.o.   MRN: [id number] SVE - unchanged despite pitocin increase to 80mus.  Had discussion with pt and FOB and pts mother  Reviewed risks/benefits of continued attempt for active labor vs proceeding with delivery vis cesarean section After discussing among themselves, they decided to proceed with c/s Reviewed procedure with risks and benefits and signed consent  To OR when ready

## 2019-03-17 NOTE — Anesthesia Postprocedure Evaluation (Signed)
Anesthesia Post Note  Patient: Kathryn Clark  Procedure(s) Performed: CESAREAN SECTION (N/A Abdomen)     Patient location during evaluation: PACU Anesthesia Type: Epidural Level of consciousness: oriented and awake and alert Pain management: pain level controlled Vital Signs Assessment: post-procedure vital signs reviewed and stable Respiratory status: spontaneous breathing, respiratory function stable and nonlabored ventilation Cardiovascular status: blood pressure returned to baseline and stable Postop Assessment: no headache, no backache, epidural receding, patient able to bend at knees and no apparent nausea or vomiting Anesthetic complications: no    Last Vitals:  Vitals:   03/17/19 1715 03/17/19 1730  BP: (!) 151/96 (!) 128/92  Pulse: (!) 101 (!) 104  Resp: (!) 21 (!) 27  Temp:    SpO2: 99% 100%    Last Pain:  Vitals:   03/17/19 1730  TempSrc:   PainSc: 5    Pain Goal:    LLE Motor Response: Purposeful movement (03/17/19 1700)   RLE Motor Response: Purposeful movement (03/17/19 1700)       Epidural/Spinal Function Cutaneous sensation: Tingles (03/17/19 1700), Patient able to flex knees: Yes (03/17/19 1700), Patient able to lift hips off bed: Yes (03/17/19 1700), Back pain beyond tenderness at insertion site: No (03/17/19 1700), Progressively worsening motor and/or sensory loss: No (03/17/19 1700), Bowel and/or bladder incontinence post epidural: No (03/17/19 1700)  Ziyon Soltau A.

## 2019-03-17 NOTE — Op Note (Addendum)
Operative Note    Preoperative Diagnosis: IUP at 24 4/7wks                                              Preeclampsia                                             Arrest of dilation and descent   Postoperative Diagnosis: Same   Procedure: Primary low transverse cesarean section with double layered closure   Surgeon: Mickle Mallory, DO  Anesthesia: Epidural and local 0.25% marcaine  Fluids: LR at 1631ml EBL: 434ml UOP: 147ml   Findings: Viable female infant in vertex position, double nuchal and body and leg cord Intubated in OR - to NICU; extubated shortly afterwards; APGARS and weight pending.  Grossly normal uterus, tubes and ovaries   Specimen: Placenta to pathology and cord gas    Procedure Note Patient was taken to the operating room where epidural anesthesia was dosed up. Pt needed to be in trendelenberg for level to be obtained. Finally 20ccs of 0.25& marcaine was injected into incision site for adequate anesthesia.  She was prepped and draped in the normal sterile fashion in the dorsal supine position with a leftward tilt. An appropriate time out was performed. Allis clamp test confirmed adequate anesthesia.  A Pfannenstiel skin incision was then made with the scalpel and carried through to the underlying layer of fascia by sharp dissection and Bovie cautery. The fascia was nicked in the midline and the incision was extended laterally with Mayo scissors. The superior then inferior aspects of the incision were grasped with Kocher clamps and dissected off the underlying rectus muscles.  Rectus muscles were separated in the midline and the peritoneal cavity entered bluntly. At this time, anesthesia had to redose patient with more medication for better pain control. The Alexis self-retaining wound retractor was then placed within the incision and the lower uterine segment exposed. The bladder flap was developed with Metzenbaum scissors and pushed away from the lower uterine segment. The  lower uterine segment was then incised in a transverse fashion and the cavity itself entered bluntly. Infants' head was delivered in OP position. Loose nuchal x 2 was reduced over infants head. Body followed with cord noted around torso and legs.  The nose and mouth were bulb suctioned  But thick mucus noted hence cord was quickly clamped and cut and infant handed off to awaiting NICU team. Cord gas and blood was obtained.  The placenta was then spontaneously expressed from the uterus and the uterus cleared of all clots and debris with moist lap sponge. The uterine incision was then repaired in 2 layers the first layer was a running locked layer 1-0 chromic and the second an imbricating layer of the same suture. Small area of bleeding mid incision was suture ligated for hemostasis.  The tubes and ovaries were inspected and found to be grossly normal. Hemostasis was appreciated. Next, the gutters were cleared of all clots and debris. The uterine incision was inspected again and confirmed to be hemostatic. All instruments and sponges as well as the Alexis retractor were then removed from the abdomen. The rectus muscles and peritoneum were then reapproximated in a running fashion using 2-0 Vicryl. The fascia was then  closed with 0 Vicryl in a running fashion. Subcutaneous tissue was reapproximated with 3-0 plain in a running fashion. The skin was closed with a subcuticular stitch of 4-0 Vicryl on a Keith needle and then reinforced with benzoin and Steri-Strips. At the conclusion of the procedure all instruments and sponge counts were correct. Patient was taken to the recovery room in good condition.  Pt did have to receive 8units of hydralazine during the case for BP control. Plan to start on MgSO4 in PACU

## 2019-03-17 NOTE — Progress Notes (Signed)
Patient ID: Kathryn Clark, female   DOB: 2000/11/23, 18 y.o.   MRN: [id number] Pt still with no complaints. +Fms VS - 124-165/91-118, afeb EFM - 145, cat 1 TOCO - ctxs q 5 mins SVE - 7/90/-1  A/P: Prime with continued slow progress; inadequate mvus; pit at 24         Pt had elevated BP which responded to first dose labetalol protocol         Had discussion with pt about indications for c/s given prolonged first stage and risks of pitocin over prolonged period.          Checked with OR and two stat/urgent c/s on board.          Given cat 1 strip at this time, pt asymptomatic re BP will continue pitocin ( max 34mus). Will try hands and knees as only position not attempted yet.

## 2019-03-17 NOTE — Addendum Note (Signed)
Addendum  created 03/17/19 1812 by Josephine Igo, MD   Intraprocedure Event edited, Intraprocedure Staff edited

## 2019-03-17 NOTE — Progress Notes (Addendum)
Patient ID: Kathryn Clark, female   DOB: 05/17/2001, 18 y.o.   MRN: [id number] Pt with no complaints. +FMs. Had decel shortly after last check so pitocin turned off for an hour then restarted at 34mus VSS; 99.6 EFM - 145, cat 1 TOCO - contractions q 5-25mins SVE - 6.5/90/-1  A/P: Prime with preE at 38 4/7wks,                      -prolonged 1st stage labor, AROM x 20hrs                     - Pitocin at 60mus, ; mvus 110          Will titrate pitocin till 58mus and if not able to proceed to active labor/ get adequate mvus, will consider c/s            Continue to monitor BP and temp

## 2019-03-17 NOTE — Progress Notes (Addendum)
Patient ID: Kathryn Clark, female   DOB: 04/12/2001, 18 y.o.   MRN: [id number] Pt still comfortable with epidural. No complaints. +Fms VS - 123-130/82-84 ( last read of 158/105 was taken while pt being position changed)          T - 99.6 EFM - 145, cat 1 TOCO - ctxs q 4-31mins  SVE - 6/90/-1  A/P: Prime with preE at 38 4/7wks prolonged labor; AROM x 15hrs                   - Pitocin at 22 ; mvus 90         Position change to exaggerated lateral lie with peanut ball        Continue with expectant mgmt ; monitor temp and BP

## 2019-03-18 LAB — CBC
HCT: 27.3 % — ABNORMAL LOW (ref 36.0–46.0)
Hemoglobin: 9 g/dL — ABNORMAL LOW (ref 12.0–15.0)
MCH: 28.5 pg (ref 26.0–34.0)
MCHC: 33 g/dL (ref 30.0–36.0)
MCV: 86.4 fL (ref 80.0–100.0)
Platelets: 175 10*3/uL (ref 150–400)
RBC: 3.16 MIL/uL — ABNORMAL LOW (ref 3.87–5.11)
RDW: 15.1 % (ref 11.5–15.5)
WBC: 16.9 10*3/uL — ABNORMAL HIGH (ref 4.0–10.5)
nRBC: 0 % (ref 0.0–0.2)

## 2019-03-18 NOTE — Lactation Note (Addendum)
This note was copied from a baby's chart. Lactation Consultation Note  Patient Name: Kathryn Clark Date: 03/18/2019 Reason for consult: Initial assessment;Primapara;1st time breastfeeding;NICU baby;Early term 37-38.6wks;Other (Comment)(teen pregnancy)  Visited with mom of a 10 hours old ETI NICU female who is currently on NPO. She's a P1 and already pumping since yesterday. Mom reported (+) breast changes during the pregnancy and she proudly told LC that she was able to fill up one of the bullets to be taken to the NICU, praised her for her efforts.   Reviewed hand expression with mom and noticed that her nipples were swollen, mom has pitting edema, her tissue is non-compressible but she's able to get plenty of colostrum. She doesn't have a pump at home, but plans to apply to [facility], she didn't get [facility] during the pregnancy though but her Dr. Referred her to the program to get a pump.   Reviewed pumping log as well as milk storage guidelines for NICU babies. [place] set mom up with breast shells, when when coming back to the room she was already asleep. Spoke to [employer] regarding breast shells, she'll remind mom to start wearing them when she wakes up.  Feeding plan  1. Encouraged mom to keep pumping every 3 hours, at least 8 pumping sessions/24 hours 2. She'll start using her nipple butter (offered coconut oil but mom prefers to use her own) prior pumping 3. She'll also start wearing her shells, daytime only  BF brochure, BF resources and NICU booklet were reviewed. Mom reported all questions and concerns were answered, she's aware of [place] OP services and will call PRN.  Maternal Data Formula Feeding for Exclusion: Yes Reason for exclusion: Mother's choice to formula and breast feed on admission Has patient been taught Hand Expression?: Yes Does the patient have breastfeeding experience prior to this delivery?: No  Feeding    Interventions Interventions: Breast feeding basics  reviewed;Breast massage;Hand express;Breast compression;Reverse pressure;DEBP;Shells  Lactation Tools Discussed/Used Tools: Pump;Shells Shell Type: Inverted Breast pump type: Double-Electric Breast Pump WIC Program: No(but plans to apply) Pump Review: Setup, frequency, and cleaning;Milk Storage Initiated by:: RN Date initiated:: 03/17/19   Consult Status Consult Status: PRN Date: 03/19/19 Follow-up type: In-patient    Deb Loudin Francene Boyers 03/18/2019, 2:38 PM

## 2019-03-18 NOTE — Progress Notes (Signed)
Subjective: Postpartum Day 1: Cesarean Delivery Patient reports incisional pain and tolerating PO.  Foley catheter just out Objective: Vital signs in last 24 hours: Temp:  [98.1 F (36.7 C)-99.7 F (37.6 C)] 98.1 F (36.7 C) (08/17 0805) Pulse Rate:  [77-121] 77 (08/17 0805) Resp:  [14-27] 20 (08/17 0805) BP: (104-165)/(52-118) 104/52 (08/17 0805) SpO2:  [98 %-100 %] 99 % (08/17 0805)  Physical Exam:  General: alert and cooperative Lochia: appropriate Uterine Fundus: firm Incision: C/D/I   Recent Labs    03/17/19 1737 03/18/19 0647  HGB 10.4* 9.0*  HCT 30.8* 27.3*    Assessment/Plan: Status post Cesarean section. Doing well postoperatively. BP improved on magnesium.  Hgb stable  Will d/c magnesium this PM, great diuresis >3L last shift   Kathryn Clark 03/18/2019, 9:10 AM

## 2019-03-19 MED ORDER — IBUPROFEN 800 MG PO TABS
800.0000 mg | ORAL_TABLET | Freq: Three times a day (TID) | ORAL | Status: DC | PRN
  Administered 2019-03-19 – 2019-03-20 (×2): 800 mg via ORAL
  Filled 2019-03-19 (×2): qty 1

## 2019-03-19 NOTE — Progress Notes (Signed)
Subjective: Postpartum Day 2: Cesarean Delivery Patient reports incisional pain, tolerating PO and no problems voiding.  Pain controlled, nl lochia Objective: Vital signs in last 24 hours: Temp:  [97.6 F (36.4 C)-99.3 F (37.4 C)] 99.3 F (37.4 C) (08/18 0819) Pulse Rate:  [86-102] 96 (08/18 0819) Resp:  [15-19] 17 (08/18 0819) BP: (107-137)/(62-98) 137/98 (08/18 0819) SpO2:  [99 %-100 %] 99 % (08/18 0819)  Physical Exam:  General: alert and no distress Lochia: appropriate Uterine Fundus: firm Incision: healing well DVT Evaluation: No evidence of DVT seen on physical exam.  Recent Labs    03/17/19 1737 03/18/19 0647  HGB 10.4* 9.0*  HCT 30.8* 27.3*    Assessment/Plan: Status post Cesarean section. Doing well postoperatively.  Continue current care.  Placido Hangartner Bovard-Stuckert 03/19/2019, 9:42 AM

## 2019-03-19 NOTE — Lactation Note (Signed)
This note was copied from a baby's chart. Lactation Consultation Note  Patient Name: Kathryn Clark [id number] Date: 03/19/2019 Reason for consult: Follow-up assessment   LC Follow Up Visit:  Attempted to visit with mother, however, she was sleeping.  I will try to return later today.   Consult Status Consult Status: Follow-up Date: 03/19/19 Follow-up type: In-patient    [name] 03/19/2019, 2:43 PM

## 2019-03-20 ENCOUNTER — Encounter (HOSPITAL_COMMUNITY): Payer: Self-pay | Admitting: *Deleted

## 2019-03-20 ENCOUNTER — Inpatient Hospital Stay (HOSPITAL_COMMUNITY)
Admission: AD | Admit: 2019-03-20 | Discharge: 2019-03-20 | Disposition: A | Discharge status: Home / Self Care | Payer: 59 | Attending: Obstetrics and Gynecology | Admitting: Obstetrics and Gynecology

## 2019-03-20 ENCOUNTER — Other Ambulatory Visit: Payer: Self-pay

## 2019-03-20 DIAGNOSIS — Z79899 Other long term (current) drug therapy: Secondary | ICD-10-CM | POA: Insufficient documentation

## 2019-03-20 DIAGNOSIS — N939 Abnormal uterine and vaginal bleeding, unspecified: Secondary | ICD-10-CM | POA: Insufficient documentation

## 2019-03-20 DIAGNOSIS — O165 Unspecified maternal hypertension, complicating the puerperium: Secondary | ICD-10-CM | POA: Diagnosis not present

## 2019-03-20 DIAGNOSIS — D649 Anemia, unspecified: Secondary | ICD-10-CM | POA: Insufficient documentation

## 2019-03-20 DIAGNOSIS — O9081 Anemia of the puerperium: Secondary | ICD-10-CM | POA: Insufficient documentation

## 2019-03-20 DIAGNOSIS — O9989 Other specified diseases and conditions complicating pregnancy, childbirth and the puerperium: Secondary | ICD-10-CM | POA: Insufficient documentation

## 2019-03-20 LAB — CBC WITH DIFFERENTIAL/PLATELET
Abs Immature Granulocytes: 0.06 10*3/uL (ref 0.00–0.07)
Basophils Absolute: 0 10*3/uL (ref 0.0–0.1)
Basophils Relative: 0 %
Eosinophils Absolute: 0.1 10*3/uL (ref 0.0–0.5)
Eosinophils Relative: 1 %
HCT: 24.9 % — ABNORMAL LOW (ref 36.0–46.0)
Hemoglobin: 8.1 g/dL — ABNORMAL LOW (ref 12.0–15.0)
Immature Granulocytes: 1 %
Lymphocytes Relative: 14 %
Lymphs Abs: 1.4 10*3/uL (ref 0.7–4.0)
MCH: 28.6 pg (ref 26.0–34.0)
MCHC: 32.5 g/dL (ref 30.0–36.0)
MCV: 88 fL (ref 80.0–100.0)
Monocytes Absolute: 0.5 10*3/uL (ref 0.1–1.0)
Monocytes Relative: 5 %
Neutro Abs: 8 10*3/uL — ABNORMAL HIGH (ref 1.7–7.7)
Neutrophils Relative %: 79 %
Platelets: 280 10*3/uL (ref 150–400)
RBC: 2.83 MIL/uL — ABNORMAL LOW (ref 3.87–5.11)
RDW: 15.7 % — ABNORMAL HIGH (ref 11.5–15.5)
WBC: 10.1 10*3/uL (ref 4.0–10.5)
nRBC: 0.2 % (ref 0.0–0.2)

## 2019-03-20 MED ORDER — LABETALOL HCL 100 MG PO TABS
200.0000 mg | ORAL_TABLET | Freq: Once | ORAL | Status: AC
  Administered 2019-03-20: 200 mg via ORAL
  Filled 2019-03-20: qty 2

## 2019-03-20 MED ORDER — ABDOMINAL BINDER/ELASTIC LARGE MISC: 1.0000 [IU] | 0 refills | Status: AC

## 2019-03-20 MED ORDER — IBUPROFEN 800 MG PO TABS: 800.0000 mg | ORAL_TABLET | Freq: Three times a day (TID) | ORAL | 1 refills | Status: DC | PRN

## 2019-03-20 MED ORDER — OXYCODONE HCL 5 MG PO TABS: 5.0000 mg | ORAL_TABLET | ORAL | 0 refills | Status: AC | PRN

## 2019-03-20 MED ORDER — LABETALOL HCL 200 MG PO TABS: 200.0000 mg | ORAL_TABLET | Freq: Two times a day (BID) | ORAL | 0 refills | Status: DC

## 2019-03-20 MED ORDER — LABETALOL HCL 100 MG PO TABS: 100.0000 mg | ORAL_TABLET | Freq: Two times a day (BID) | ORAL | 1 refills | Status: DC

## 2019-03-20 MED ORDER — DOCUSATE SODIUM 100 MG PO CAPS: 100.0000 mg | ORAL_CAPSULE | Freq: Two times a day (BID) | ORAL | 0 refills | Status: DC

## 2019-03-20 NOTE — Discharge Instructions (Signed)
Anemia  Anemia is a condition in which you do not have enough red blood cells or hemoglobin. Hemoglobin is a substance in red blood cells that carries oxygen. When you do not have enough red blood cells or hemoglobin (are anemic), your body cannot get enough oxygen and your organs may not work properly. As a result, you may feel very tired or have other problems. What are the causes? Common causes of anemia include:  Excessive bleeding. Anemia can be caused by excessive bleeding inside or outside the body, including bleeding from the intestine or from periods in women.  Poor nutrition.  Long-lasting (chronic) kidney, thyroid, and liver disease.  Bone marrow disorders.  Cancer and treatments for cancer.  HIV (human immunodeficiency virus) and AIDS (acquired immunodeficiency syndrome).  Treatments for HIV and AIDS.  Spleen problems.  Blood disorders.  Infections, medicines, and autoimmune disorders that destroy red blood cells. What are the signs or symptoms? Symptoms of this condition include:  Minor weakness.  Dizziness.  Headache.  Feeling heartbeats that are irregular or faster than normal (palpitations).  Shortness of breath, especially with exercise.  Paleness.  Cold sensitivity.  Indigestion.  Nausea.  Difficulty sleeping.  Difficulty concentrating. Symptoms may occur suddenly or develop slowly. If your anemia is mild, you may not have symptoms. How is this diagnosed? This condition is diagnosed based on:  Blood tests.  Your medical history.  A physical exam.  Bone marrow biopsy. Your health care provider may also check your stool (feces) for blood and may do additional testing to look for the cause of your bleeding. You may also have other tests, including:  Imaging tests, such as a CT scan or MRI.  Endoscopy.  Colonoscopy. How is this treated? Treatment for this condition depends on the cause. If you continue to lose a lot of blood, you  may need to be treated at a hospital. Treatment may include:  Taking supplements of iron, vitamin O35, or folic acid.  Taking a hormone medicine (erythropoietin) that can help to stimulate red blood cell growth.  Having a blood transfusion. This may be needed if you lose a lot of blood.  Making changes to your diet.  Having surgery to remove your spleen. Follow these instructions at home:  Take over-the-counter and prescription medicines only as told by your health care provider.  Take supplements only as told by your health care provider.  Follow any diet instructions that you were given.  Keep all follow-up visits as told by your health care provider. This is important. Contact a health care provider if:  You develop new bleeding anywhere in the body. Get help right away if:  You are very weak.  You are short of breath.  You have pain in your abdomen or chest.  You are dizzy or feel faint.  You have trouble concentrating.  You have bloody or black, tarry stools.  You vomit repeatedly or you vomit up blood. Summary  Anemia is a condition in which you do not have enough red blood cells or enough of a substance in your red blood cells that carries oxygen (hemoglobin).  Symptoms may occur suddenly or develop slowly.  If your anemia is mild, you may not have symptoms.  This condition is diagnosed with blood tests as well as a medical history and physical exam. Other tests may be needed.  Treatment for this condition depends on the cause of the anemia. This information is not intended to replace advice given to you  by your health care provider. Make sure you discuss any questions you have with your health care provider. Document Released: 08/25/2004 Document Revised: 06/30/2017 Document Reviewed: 08/19/2016 Elsevier Patient Education  2020 [place]. Hypertension During Pregnancy High blood pressure (hypertension) is when the force of blood pumping through the  arteries is too strong. Arteries are blood vessels that carry blood from the heart throughout the body. Hypertension during pregnancy can be mild or severe. Severe hypertension during pregnancy (preeclampsia) is a medical emergency that requires prompt evaluation and treatment. Different types of hypertension can happen during pregnancy. These include:  Chronic hypertension. This happens when you had high blood pressure before you became pregnant, and it continues during the pregnancy. Hypertension that develops before you are [redacted] weeks pregnant and continues during the pregnancy is also called chronic hypertension. If you have chronic hypertension, it will not go away after you have your baby. You will need follow-up visits with your health care provider after you have your baby. Your doctor may want you to keep taking medicine for your blood pressure.  Gestational hypertension. This is hypertension that develops after the 20th week of pregnancy. Gestational hypertension usually goes away after you have your baby, but your health care provider will need to monitor your blood pressure to make sure that it is getting better.  Preeclampsia. This is severe hypertension during pregnancy. This can cause serious complications for you and your baby and can also cause complications for you after the delivery of your baby.  Postpartum preeclampsia. You may develop severe hypertension after giving birth. This usually occurs within 48 hours after childbirth but may occur up to 6 weeks after giving birth. This is rare. How does this affect me? Women who have hypertension during pregnancy have a greater chance of developing hypertension later in life or during future pregnancies. In some cases, hypertension during pregnancy can cause serious complications, such as:  Stroke.  Heart attack.  Injury to other organs, such as kidneys, lungs, or liver.  Preeclampsia.  Convulsions or seizures.  Placental  abruption. How does this affect my baby? Hypertension during pregnancy can affect your baby. Your baby may:  Be born early (prematurely).  Not weigh as much as he or she should at birth (low birth weight).  Not tolerate labor well, leading to an unplanned cesarean delivery. What are the risks? There are certain factors that make it more likely for you to develop hypertension during pregnancy. These include:  Having hypertension during a previous pregnancy.  Being overweight.  Being age 21 or older.  Being pregnant for the first time.  Being pregnant with more than one baby.  Becoming pregnant using fertilization methods, such as IVF (in vitro fertilization).  Having other medical problems, such as diabetes, kidney disease, or lupus.  Having a family history of hypertension. What can I do to lower my risk? The exact cause of hypertension during pregnancy is not known. You may be able to lower your risk by:  Maintaining a healthy weight.  Eating a healthy and balanced diet.  Following your health care provider's instructions about treating any long-term conditions that you had before becoming pregnant. It is very important to keep all of your prenatal care appointments. Your health care provider will check your blood pressure and make sure that your pregnancy is progressing as expected. If a problem is found, early treatment can prevent complications. How is this treated? Treatment for hypertension during pregnancy varies depending on the type of hypertension you  have and how serious it is.  If you were taking medicine for high blood pressure before you became pregnant, talk with your health care provider. You may need to change medicine during pregnancy because some medicines, like ACE inhibitors, may not be considered safe for your baby.  If you have gestational hypertension, your health care provider may order medicine to treat this during pregnancy.  If you are at risk  for preeclampsia, your health care provider may recommend that you take a low-dose aspirin during your pregnancy.  If you have severe hypertension, you may need to be hospitalized so you and your baby can be monitored closely. You may also need to be given medicine to lower your blood pressure. This medicine may be given by mouth or through an IV.  In some cases, if your condition gets worse, you may need to deliver your baby early. Follow these instructions at home: Eating and drinking   Drink enough fluid to keep your urine pale yellow.  Avoid caffeine. Lifestyle  Do not use any products that contain nicotine or tobacco, such as cigarettes, e-cigarettes, and chewing tobacco. If you need help quitting, ask your health care provider.  Do not use alcohol or drugs.  Avoid stress as much as possible.  Rest and get plenty of sleep.  Regular exercise can help to reduce your blood pressure. Ask your health care provider what kinds of exercise are best for you. General instructions  Take over-the-counter and prescription medicines only as told by your health care provider.  Keep all prenatal and follow-up visits as told by your health care provider. This is important. Contact a health care provider if:  You have symptoms that your health care provider told you may require more treatment or monitoring, such as: ? Headaches. ? Nausea or vomiting. ? Abdominal pain. ? Dizziness. ? Light-headedness. Get help right away if:  You have: ? Severe abdominal pain that does not get better with treatment. ? A severe headache that does not get better. ? Vomiting that does not get better. ? Sudden, rapid weight gain. ? Sudden swelling in your hands, ankles, or face. ? Vaginal bleeding. ? Blood in your urine. ? Blurred or double vision. ? Shortness of breath or chest pain. ? Weakness on one side of your body. ? Difficulty speaking.  Your baby is not moving as much as  usual. Summary  High blood pressure (hypertension) is when the force of blood pumping through the arteries is too strong.  Hypertension during pregnancy can cause problems for you and your baby.  Treatment for hypertension during pregnancy varies depending on the type of hypertension you have and how serious it is.  Keep all prenatal and follow-up visits as told by your health care provider. This is important. This information is not intended to replace advice given to you by your health care provider. Make sure you discuss any questions you have with your health care provider. Document Released: 04/05/2011 Document Revised: 11/08/2018 Document Reviewed: 08/14/2018 Elsevier Patient Education  2020 [employer].

## 2019-03-20 NOTE — Discharge Instructions (Signed)
Call office with any concerns [phone] 

## 2019-03-20 NOTE — Lactation Note (Signed)
This note was copied from a baby's chart. Lactation Consultation Note  Patient Name: Kathryn Clark [id number] Date: 03/20/2019 Reason for consult: Follow-up assessment;Primapara;1st time breastfeeding;NICU baby;Early term 37-38.6wks;Infant weight loss;Other (Comment)(milk is in bilaterally)  Baby is 59 hours old Per mom the baby may be D/C today from NICU .  Per mom last breast fed last evening at the breast and did well.  Per mom did not pump all night and pumped this morning and it was 90 ml.  LC praised mom for her pumping.  LC stressed the importance of consistent pumping  Around the clock if the baby isn't latching.  LC recommended since the baby is and early term infant  To feed th 1st breast for 15 -20 mins supplement at least 30 ml - call it a feeding, and post pump both breast.  Sore nipple and engorgement prevention and tx reviewed.  LC recommended for mom to call [facility] this am and [facility] faxed a DEBP referral for baby in NICU.  Storage of breast milk reviewed.  LC also recommended after the baby is D/C from NICU to come  back for [facility] O/P appt in about 1 week .  Mom receptive and LC placed a request in the Epic basket for the clinic to call mom to set up the appt and mom aware.    Maternal Data Has patient been taught Hand Expression?: Yes  Feeding    LATCH Score                   Interventions Interventions: Breast feeding basics reviewed  Lactation Tools Discussed/Used Tools: Pump Breast pump type: Double-Electric Breast Pump WIC Program: Yes Pump Review: Milk Storage   Consult Status Consult Status: PRN Follow-up type: In-patient    Combined Locks 03/20/2019, 9:46 AM

## 2019-03-20 NOTE — Discharge Summary (Addendum)
OB Discharge Summary     Patient Name: Kathryn Clark DOB: [birth year] MRN: [id number]  Date of admission: 03/15/2019 Delivering MD: Carlynn Purl [facility]   Date of discharge: 03/20/2019  Admitting diagnosis: PREG Intrauterine pregnancy: 105w4d     Secondary diagnosis:  Active Problems:   Pre-eclampsia during pregnancy in third trimester, antepartum   Arrest of dilation, delivered, current hospitalization   Status post cesarean delivery   Postpartum care following cesarean delivery  Additional problems: none     Discharge diagnosis: Term Pregnancy Delivered and Preeclampsia (mild)                                                                                                Post partum procedures:none  Augmentation: AROM, Pitocin, Cytotec and Foley Balloon  Complications: Intrauterine Inflammation or infection (Chorioamniotis) and ROM>24 hours  Hospital course:  Induction of Labor With Cesarean Section  18 y.o. yo G1P1001 at [redacted]w[redacted]d was admitted to the hospital 03/15/2019 for induction of labor. Patient had a labor course significant for preeclampsia. The patient went for cesarean section due to Arrest of Dilation and Arrest of Descent, and delivered a Viable infant,03/17/2019  Membrane Rupture Time/Date: 12:44 PM ,03/16/2019   Details of operation can be found in separate operative Note.  Patient had an uncomplicated postpartum course. She is ambulating, tolerating a regular diet, passing flatus, and urinating well.  Patient is discharged home in stable condition on 03/20/19.                                    Physical exam  Vitals:   03/19/19 1956 03/19/19 2122 03/20/19 0017 03/20/19 0534  BP: 131/78 130/78 129/83 136/85  Pulse: 86 89 74 91  Resp: 18  16 17   Temp: 98.1 F (36.7 C)  98 F (36.7 C) 98.9 F (37.2 C)  TempSrc: Oral  Oral Oral  SpO2: 100%  100% 100%  Weight:      Height:       General: alert, cooperative and no distress Lochia: appropriate Uterine Fundus:  firm Incision: Dressing is clean, dry, and intact DVT Evaluation: No evidence of DVT seen on physical exam. Labs: Lab Results  Component Value Date   WBC 16.9 (H) 03/18/2019   HGB 9.0 (L) 03/18/2019   HCT 27.3 (L) 03/18/2019   MCV 86.4 03/18/2019   PLT 175 03/18/2019   CMP Latest Ref Rng & Units 03/15/2019  Glucose 70 - 99 mg/dL 94  BUN 6 - 20 mg/dL 9  Creatinine 0.44 - 1.00 mg/dL 0.85  Sodium 135 - 145 mmol/L 137  Potassium 3.5 - 5.1 mmol/L 3.9  Chloride 98 - 111 mmol/L 108  CO2 22 - 32 mmol/L 18(L)  Calcium 8.9 - 10.3 mg/dL 8.5(L)  Total Protein 6.5 - 8.1 g/dL 5.8(L)  Total Bilirubin 0.3 - 1.2 mg/dL 0.3  Alkaline Phos 38 - 126 U/L 338(H)  AST 15 - 41 U/L 21  ALT 0 - 44 U/L 9    Discharge instruction: per After Visit Summary and "Baby  and Me Booklet".  After visit meds:  Allergies as of 03/20/2019   No Known Allergies     Medication List    TAKE these medications   Abdominal Binder/Elastic Large Misc 1 Units by Does not apply route 1 day or 1 dose for 1 dose.   ibuprofen 800 MG tablet Commonly known as: ADVIL Take 1 tablet (800 mg total) by mouth every 8 (eight) hours as needed for moderate pain or cramping.   labetalol 100 MG tablet Commonly known as: NORMODYNE Take 1 tablet (100 mg total) by mouth 2 (two) times daily.   oxyCODONE 5 MG immediate release tablet Commonly known as: Oxy IR/ROXICODONE Take 1-2 tablets (5-10 mg total) by mouth every 4 (four) hours as needed for up to 7 days for moderate pain or severe pain.   promethazine 12.5 MG tablet Commonly known as: PHENERGAN Take 1 tablet (12.5 mg total) by mouth every 6 (six) hours as needed for nausea or vomiting.   ranitidine 150 MG tablet Commonly known as: ZANTAC Take 1 tablet (150 mg total) by mouth 2 (two) times daily.       Diet: routine  Activity: Advance as tolerated. Pelvic rest for 6 weeks.   Outpatient follow up:One week for BP visit, two weeks for incision check and 6 weeks for  postpartum visit Follow up Appt:No future appointments. Follow up Visit:No follow-ups on file.  Postpartum contraception: Not Discussed  Newborn Data: Live born female  Birth Weight: 6 lb 3.7 oz (2825 g) APGAR: 0, 1  Newborn Delivery   Birth date/time: 03/17/2019 15:29:00 Delivery type: C-Section, Low Transverse Trial of labor: Yes C-section categorization: Primary      Baby Feeding: Bottle and Breast Disposition:home with mother   03/20/2019 Cathrine Musterecilia W Jayden Kratochvil, DO

## 2019-03-20 NOTE — MAU Note (Addendum)
Discharged today for postpartum C/S.  Passed a large blood clot after she got home.  Reports mild cramping. States she felt it as she was leaving today.  Reports she is breastfeeding.  Not feeling light-headed or dizzy.  States she had gestational hypertension-wasn't able to pick up her meds yet so she hasn't taken them.

## 2019-03-20 NOTE — MAU Provider Note (Signed)
Chief Complaint: Vaginal Bleeding   First Provider Initiated Contact with Patient 03/20/19 2142     SUBJECTIVE HPI: Kathryn Clark is a 18 y.o. G1P1001 at 3 days post partum who presents to Maternity Admissions reporting vaginal bleeding. Patient states she was discharged today at around 5 pm. Had a c/section after failed induction for preeclampsia. Received Mag sulfate during her labor for PEC. States prior to discharge, she had some bright red staining on her pads. Right after she was discharged she felt something coming out of her & passed a large blood clot (~10 cm per patient's description). Vaginal bleeding has not increased since then but does reports some really small blood clots since then. Denies abdominal pain other than some incisional discomfort.  Was discharged home on labetalol 100 mg BID but hasn't been able to pick up the rx yet.  Denies headache, visual disturbance, or epigastric pain.   Past Medical History:  Diagnosis Date  . Arrest of dilation, delivered, current hospitalization 03/17/2019  . Status post cesarean delivery 03/17/2019   OB History  Gravida Para Term Preterm AB Living  1 1 1     1   SAB TAB Ectopic Multiple Live Births        0 1    # Outcome Date GA Lbr Len/2nd Weight Sex Delivery Anes PTL Lv  1 Term 03/17/19 [id number]  2825 g M CS-LTranv EPI  LIV   Past Surgical History:  Procedure Laterality Date  . CESAREAN SECTION N/A 03/17/2019   Procedure: CESAREAN SECTION;  Surgeon: Edwinna AreolaBanga, Cecilia Worema, DO;  Location: [facility];  Service: Obstetrics;  Laterality: N/A;   Social History   Socioeconomic History  . Marital status: Single    Spouse name: Not on file  . Number of children: Not on file  . Years of education: Not on file  . Highest education level: Not on file  Occupational History  . Not on file  Social Needs  . Financial resource strain: Not on file  . Food insecurity    Worry: Not on file    Inability: Not on file  . Transportation needs     Medical: Not on file    Non-medical: Not on file  Tobacco Use  . Smoking status: Never Smoker  . Smokeless tobacco: Never Used  Substance and Sexual Activity  . Alcohol use: No  . Drug use: No  . Sexual activity: Yes    Birth control/protection: None  Lifestyle  . Physical activity    Days per week: Not on file    Minutes per session: Not on file  . Stress: Not on file  Relationships  . Social Musicianconnections    Talks on phone: Not on file    Gets together: Not on file    Attends religious service: Not on file    Active member of club or organization: Not on file    Attends meetings of clubs or organizations: Not on file    Relationship status: Not on file  . Intimate partner violence    Fear of current or ex partner: Not on file    Emotionally abused: Not on file    Physically abused: Not on file    Forced sexual activity: Not on file  Other Topics Concern  . Not on file  Social History Narrative  . Not on file   Family History  Problem Relation Age of Onset  . Healthy Mother   . Healthy Father    No current facility-administered  medications on file prior to encounter.    Current Outpatient Medications on File Prior to Encounter  Medication Sig Dispense Refill  . Elastic Bandages & Supports (ABDOMINAL BINDER/ELASTIC LARGE) MISC 1 Units by Does not apply route 1 day or 1 dose for 1 dose. 1 each 0  . ibuprofen (ADVIL) 800 MG tablet Take 1 tablet (800 mg total) by mouth every 8 (eight) hours as needed for moderate pain or cramping. 40 tablet 1  . oxyCODONE (OXY IR/ROXICODONE) 5 MG immediate release tablet Take 1-2 tablets (5-10 mg total) by mouth every 4 (four) hours as needed for up to 7 days for moderate pain or severe pain. 30 tablet 0  . promethazine (PHENERGAN) 12.5 MG tablet Take 1 tablet (12.5 mg total) by mouth every 6 (six) hours as needed for nausea or vomiting. 30 tablet 0  . ranitidine (ZANTAC) 150 MG tablet Take 1 tablet (150 mg total) by mouth 2 (two) times  daily. 60 tablet 0   No Known Allergies  I have reviewed patient's Past Medical Hx, Surgical Hx, Family Hx, Social Hx, medications and allergies.   Review of Systems  Constitutional: Negative.   HENT: Negative for sore throat.   Eyes: Negative for visual disturbance.  Respiratory: Negative for shortness of breath.   Cardiovascular: Negative for chest pain.  Gastrointestinal: Negative.   Genitourinary: Positive for vaginal bleeding.  Neurological: Negative for headaches.    OBJECTIVE Patient Vitals for the past 24 hrs:  BP Temp Temp src Pulse Resp Weight  03/20/19 2249 - 99.1 F (37.3 C) Oral - - -  03/20/19 2245 (!) 146/95 - - 97 - -  03/20/19 2240 (!) 135/94 - - (!) 103 - -  03/20/19 2215 (!) 144/93 - - 98 - -  03/20/19 2200 (!) 146/98 99.8 F (37.7 C) Oral (!) 101 - -  03/20/19 2145 (!) 150/93 - - (!) 103 - -  03/20/19 2138 (!) 154/98 - - (!) 103 - -  03/20/19 2116 (!) 149/96 99.6 F (37.6 C) - (!) 108 19 91.2 kg   Constitutional: Well-developed, well-nourished female in no acute distress.  Cardiovascular: normal rate & rhythm, no murmur Respiratory: normal rate and effort. Lung sounds clear throughout GI: honeycomb dressing clean & intact. Mild tenderness over fundus. Abd soft, non-tender, Pos BS x 4. No guarding or rebound tenderness MS: Extremities nontender, no edema, normal ROM Neurologic: Alert and oriented x 4.  Breasts: slightly firm & engorged. No signs of infection   LAB RESULTS Results for orders placed or performed during the hospital encounter of 03/20/19 (from the past 24 hour(s))  CBC with Differential/Platelet     Status: Abnormal   Collection Time: 03/20/19 10:08 PM  Result Value Ref Range   WBC 10.1 4.0 - 10.5 K/uL   RBC 2.83 (L) 3.87 - 5.11 MIL/uL   Hemoglobin 8.1 (L) 12.0 - 15.0 g/dL   HCT 82.924.9 (L) 56.236.0 - 13.046.0 %   MCV 88.0 80.0 - 100.0 fL   MCH 28.6 26.0 - 34.0 pg   MCHC 32.5 30.0 - 36.0 g/dL   RDW 86.515.7 (H) 78.411.5 - 69.615.5 %   Platelets 280 150 -  400 K/uL   nRBC 0.2 0.0 - 0.2 %   Neutrophils Relative % 79 %   Neutro Abs 8.0 (H) 1.7 - 7.7 K/uL   Lymphocytes Relative 14 %   Lymphs Abs 1.4 0.7 - 4.0 K/uL   Monocytes Relative 5 %   Monocytes Absolute 0.5 0.1 - 1.0  K/uL   Eosinophils Relative 1 %   Eosinophils Absolute 0.1 0.0 - 0.5 K/uL   Basophils Relative 0 %   Basophils Absolute 0.0 0.0 - 0.1 K/uL   Immature Granulocytes 1 %   Abs Immature Granulocytes 0.06 0.00 - 0.07 K/uL    IMAGING No results found.  MAU COURSE Orders Placed This Encounter  Procedures  . CBC with Differential/Platelet  . Discharge patient   Meds ordered this encounter  Medications  . labetalol (NORMODYNE) tablet 200 mg  . labetalol (NORMODYNE) 200 MG tablet    Sig: Take 1 tablet (200 mg total) by mouth 2 (two) times daily.    Dispense:  60 tablet    Refill:  0    Order Specific Question:   Supervising Provider    Answer:   Aletha Halim [id number]  . docusate sodium (COLACE) 100 MG capsule    Sig: Take 1 capsule (100 mg total) by mouth 2 (two) times daily.    Dispense:  60 capsule    Refill:  0    Order Specific Question:   Supervising Provider    Answer:   Aletha Halim [[id number]]    MDM Elevated BPs. None severe range. Pt asymptomatic. Patient was induced for preeclampsia & received mag sulfate while in the hospital. Hasn't picked up her meds yet. Reviewed with Dr. Ilda Basset, will increase labetalol to 200mg  BID. Pt was given dose while in MAU this evening. Has BP check in the office on Monday.   On exam, bleeding is appropriate for patient's postpartum state. Hemoglobin is stable considering recent pregnancy & surgery. Pt has iron supplement at home, instructed to take BID. Will send in stool softener to take as well.   ASSESSMENT 1. Postpartum hypertension   2. Anemia, unspecified type     PLAN Discharge home in stable condition. Discussed reasons to return to MAU Rx labetalol & colace Keep f/u with OB  Follow-up Information     [facility] Follow up.   Why: keep scheduled appointment Contact information: [address]  [address] [place] [address] [phone]        [facility] Follow up.   Specialty: Obstetrics and Gynecology Why: return for worsening symptoms Contact information: [address] [id number] [place] [phone]         Allergies as of 03/20/2019   No Known Allergies     Medication List    TAKE these medications   Abdominal Binder/Elastic Large Misc 1 Units by Does not apply route 1 day or 1 dose for 1 dose.   docusate sodium 100 MG capsule Commonly known as: COLACE Take 1 capsule (100 mg total) by mouth 2 (two) times daily.   ibuprofen 800 MG tablet Commonly known as: ADVIL Take 1 tablet (800 mg total) by mouth every 8 (eight) hours as needed for moderate pain or cramping.   labetalol 200 MG tablet Commonly known as: NORMODYNE Take 1 tablet (200 mg total) by mouth 2 (two) times daily. What changed:   medication strength  how much to take   oxyCODONE 5 MG immediate release tablet Commonly known as: Oxy IR/ROXICODONE Take 1-2 tablets (5-10 mg total) by mouth every 4 (four) hours as needed for up to 7 days for moderate pain or severe pain.   promethazine 12.5 MG tablet Commonly known as: PHENERGAN Take 1 tablet (12.5 mg total) by mouth every 6 (six) hours as needed for nausea or vomiting.  ranitidine 150 MG tablet Commonly known as: ZANTAC Take 1 tablet (150 mg total) by mouth 2 (two) times daily.        Judeth HornLawrence, Tamalyn Wadsworth, NP 03/20/2019  11:10 PM

## 2019-03-20 NOTE — Lactation Note (Signed)
This note was copied from a baby's chart. Lactation Consultation Note  Patient Name: Kathryn Clark Date: 03/20/2019  checked to see if mom had heard from [facility] / [place] and she had. Mom mentioned since she was not active with [facility] they would call  her back with a F/U appt to obtain a DEBP.next week.  Per mom was told in NICU the baby will be going home with her today.  LC recommended since her milk is coming in , a hand pump would do for now.  Lc instructed mom  On the use of the hand pump and showed her how to use it.  Increased the flange to #27 F and per mom  More comfortable and softening off breast.  LC reviewed engorgement prevention and tx.  LC reminded mom to forget to take her DEBP kit home  So if she had to come back and rent a DEBP from the gift shop she could.  LC also stressed if the baby only feeds 1st breast release the 2nd breast down to comfort and save the milk.  LC offered to request and Lc O/P appt of next week and mom receptive.  LC placed a request in the Epic basket for the clinic to call mom.  Mom aware of the [facility] resources after D/C.    Maternal Data    Feeding Feeding Type: Formula Nipple Type: Slow - flow  LATCH Score                   Interventions    Lactation Tools Discussed/Used     Consult Status      [place] 03/20/2019, 2:05 PM

## 2019-03-20 NOTE — Progress Notes (Signed)
Patient ID: Kathryn Clark, female   DOB: [birth year], 17 y.o.   MRN: [id number] POD#3 Pt doing well. Reports some incision pain but managed with meds. Lochia mild. Denies HA/SOB/CP. She is ambulating and tolerating diet well. Ready for discharge to home today VSS - 136/85, 91 GEN - NAD ABD - FF, dressing c/d/i EXT - no homans  A/P: POD#3 s/p pltc/s for arrest of labor - stable         BP stable         Discharge to home today - instructions reviewed

## 2019-08-06 ENCOUNTER — Other Ambulatory Visit: Payer: Self-pay

## 2019-08-06 ENCOUNTER — Encounter (HOSPITAL_COMMUNITY): Payer: Self-pay | Admitting: Emergency Medicine

## 2019-08-06 ENCOUNTER — Ambulatory Visit (HOSPITAL_COMMUNITY)
Admission: EM | Admit: 2019-08-06 | Discharge: 2019-08-06 | Disposition: A | Discharge status: Home / Self Care | Payer: 59 | Attending: Family Medicine | Admitting: Family Medicine

## 2019-08-06 DIAGNOSIS — S90455A Superficial foreign body, left lesser toe(s), initial encounter: Secondary | ICD-10-CM | POA: Diagnosis not present

## 2019-08-06 MED ORDER — LIDOCAINE-EPINEPHRINE-TETRACAINE (LET) TOPICAL GEL
TOPICAL | Status: AC
  Filled 2019-08-06: qty 3

## 2019-08-06 MED ORDER — LIDOCAINE-EPINEPHRINE-TETRACAINE (LET) TOPICAL GEL
3.0000 mL | Freq: Once | TOPICAL | Status: AC
  Administered 2019-08-06: 3 mL via TOPICAL

## 2019-08-06 NOTE — ED Notes (Signed)
Cleansed wound with wound cleanser

## 2019-08-06 NOTE — ED Notes (Signed)
Placed LET to left 4th toe

## 2019-08-06 NOTE — ED Triage Notes (Signed)
Injury to left toe next to little toe.  Reports a picture frame fell off wall, hit top of dresser, glass shattered and glass pierced to.  This occurred today.  Bleeding controlled

## 2019-08-06 NOTE — Discharge Instructions (Signed)
Watch for infection Return as needed 

## 2019-08-06 NOTE — ED Provider Notes (Signed)
[facility]    CSN: [id number] Arrival date & time: 08/06/19  1406      History   Chief Complaint Chief Complaint  Patient presents with  . Foot Injury    HPI Kathryn Clark is a 19 y.o. female.   HPI  Patient states that a picture frame fell off the wall, hit a dresser, and the glass shattered and 1 shards is still in her toe.  Bleeding controlled with pressure.  She is here for foreign body removal.  Tetanus is up-to-date.  Past Medical History:  Diagnosis Date  . Arrest of dilation, delivered, current hospitalization 03/17/2019  . Status post cesarean delivery 03/17/2019    Patient Active Problem List   Diagnosis Date Noted  . Arrest of dilation, delivered, current hospitalization 03/17/2019  . Status post cesarean delivery 03/17/2019  . Postpartum care following cesarean delivery 03/17/2019  . Pre-eclampsia during pregnancy in third trimester, antepartum 03/15/2019  . Morning sickness 09/23/2018    Past Surgical History:  Procedure Laterality Date  . CESAREAN SECTION N/A 03/17/2019   Procedure: CESAREAN SECTION;  Surgeon: Edwinna Areola, DO;  Location: [facility];  Service: Obstetrics;  Laterality: N/A;    OB History    Gravida  1   Para  1   Term  1   Preterm      AB      Living  1     SAB      TAB      Ectopic      Multiple  0   Live Births  1            Home Medications    Prior to Admission medications   Medication Sig Start Date End Date Taking? Authorizing Provider  labetalol (NORMODYNE) 200 MG tablet Take 1 tablet (200 mg total) by mouth 2 (two) times daily. 03/20/19 08/06/19  Judeth Horn, NP  promethazine (PHENERGAN) 12.5 MG tablet Take 1 tablet (12.5 mg total) by mouth every 6 (six) hours as needed for nausea or vomiting. 09/23/18 08/06/19  Raelyn Mora, CNM  ranitidine (ZANTAC) 150 MG tablet Take 1 tablet (150 mg total) by mouth 2 (two) times daily. 09/23/18 08/06/19  Raelyn Mora, CNM    Family History Family  History  Problem Relation Age of Onset  . Healthy Mother   . Healthy Father     Social History Social History   Tobacco Use  . Smoking status: Never Smoker  . Smokeless tobacco: Never Used  Substance Use Topics  . Alcohol use: No  . Drug use: No     Allergies   Patient has no known allergies.   Review of Systems Review of Systems  Skin: Positive for wound.  Patient denies other symptoms.   Physical Exam Triage Vital Signs ED Triage Vitals  Enc Vitals Group     BP 08/06/19 1551 119/81     Pulse Rate 08/06/19 1551 86     Resp 08/06/19 1551 18     Temp 08/06/19 1551 98.5 F (36.9 C)     Temp Source 08/06/19 1551 Oral     SpO2 08/06/19 1551 100 %     Weight --      Height --      Head Circumference --      Peak Flow --      Pain Score 08/06/19 1548 0     Pain Loc --      Pain Edu? --  Excl. in GC? --    No data found.  Updated Vital Signs BP 119/81 (BP Location: Right Arm)   Pulse 86   Temp 98.5 F (36.9 C) (Oral)   Resp 18   SpO2 100%      Physical Exam Constitutional:      General: She is not in acute distress.    Appearance: She is well-developed and normal weight.  HENT:     Head: Normocephalic and atraumatic.     Mouth/Throat:     Comments: Mask in place Eyes:     Conjunctiva/sclera: Conjunctivae normal.     Pupils: Pupils are equal, round, and reactive to light.  Cardiovascular:     Rate and Rhythm: Normal rate.  Pulmonary:     Effort: Pulmonary effort is normal. No respiratory distress.  Abdominal:     General: There is no distension.     Palpations: Abdomen is soft.  Musculoskeletal:        General: Normal range of motion.     Cervical back: Normal range of motion.  Skin:    General: Skin is warm and dry.     Comments: Third toe on left foot has small laceration, 1 cm, C shaped.  It is anesthetized with LET.  Foreign body was easily removed with forceps.  Neurological:     Mental Status: She is alert.      UC Treatments  / Results  Labs (all labs ordered are listed, but only abnormal results are displayed) Labs Reviewed - No data to display  EKG   Radiology No results found.  Procedures Procedures (including critical care time)  Medications Ordered in UC Medications  lidocaine-EPINEPHrine-tetracaine (LET) topical gel (3 mLs Topical Given 08/06/19 1623)    Initial Impression / Assessment and Plan / UC Course  I have reviewed the triage vital signs and the nursing notes.  Pertinent labs & imaging results that were available during my care of the patient were reviewed by me and considered in my medical decision making (see chart for details).     Wound care discussed.  Signs of infection reviewed.  Return as needed Final Clinical Impressions(s) / UC Diagnoses   Final diagnoses:  Foreign body of toe of left foot, initial encounter     Discharge Instructions     Watch for infection Return as needed   ED Prescriptions    None     PDMP not reviewed this encounter.   Raylene Everts, MD 08/06/19 2125

## 2019-08-28 DIAGNOSIS — Z308 Encounter for other contraceptive management: Secondary | ICD-10-CM | POA: Diagnosis not present

## 2019-11-24 ENCOUNTER — Ambulatory Visit (HOSPITAL_COMMUNITY)
Admission: EM | Admit: 2019-11-24 | Discharge: 2019-11-24 | Disposition: A | Discharge status: Home / Self Care | Payer: 59

## 2020-04-13 DIAGNOSIS — Z3202 Encounter for pregnancy test, result negative: Secondary | ICD-10-CM | POA: Diagnosis not present

## 2020-04-13 DIAGNOSIS — Z309 Encounter for contraceptive management, unspecified: Secondary | ICD-10-CM | POA: Diagnosis not present

## 2020-04-13 DIAGNOSIS — N911 Secondary amenorrhea: Secondary | ICD-10-CM | POA: Diagnosis not present

## 2020-10-01 DIAGNOSIS — O021 Missed abortion: Secondary | ICD-10-CM | POA: Diagnosis not present

## 2020-10-01 DIAGNOSIS — Z01818 Encounter for other preprocedural examination: Secondary | ICD-10-CM | POA: Diagnosis not present

## 2020-10-19 IMAGING — US US OB COMP LESS 14 WK
1 series · 14 of 28 positions shown · non-contrast
Comparison: None.

CLINICAL DATA: Right lower quadrant pain.  Beta HCG 85088

EXAM:
OBSTETRIC <14 WK ULTRASOUND
TECHNIQUE: Transabdominal ultrasound was performed for evaluation of the
gestation as well as the maternal uterus and adnexal regions.

[Series 1: us ob comp less 14 wk · 48 acquisitions, 14 frames shown]
[im 2/48]
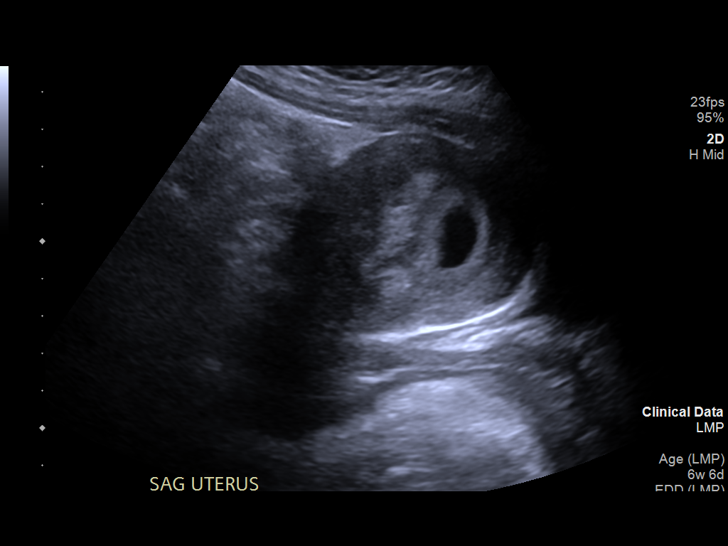
[im 6/48]
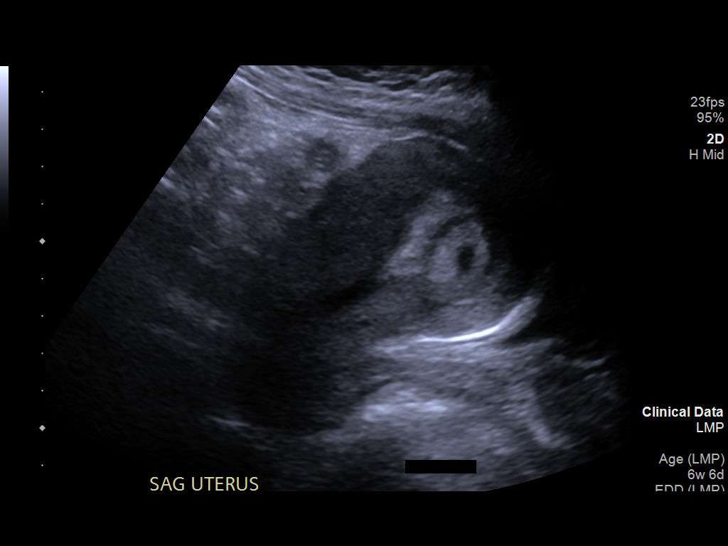
[im 9/48]
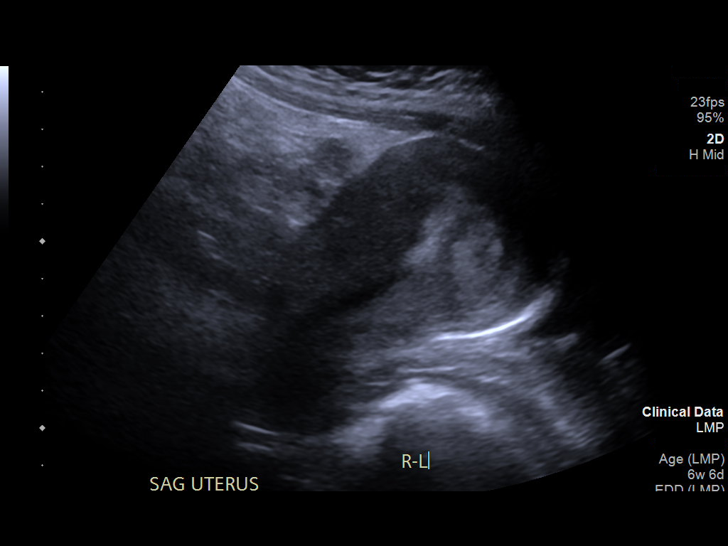
[im 13/48]
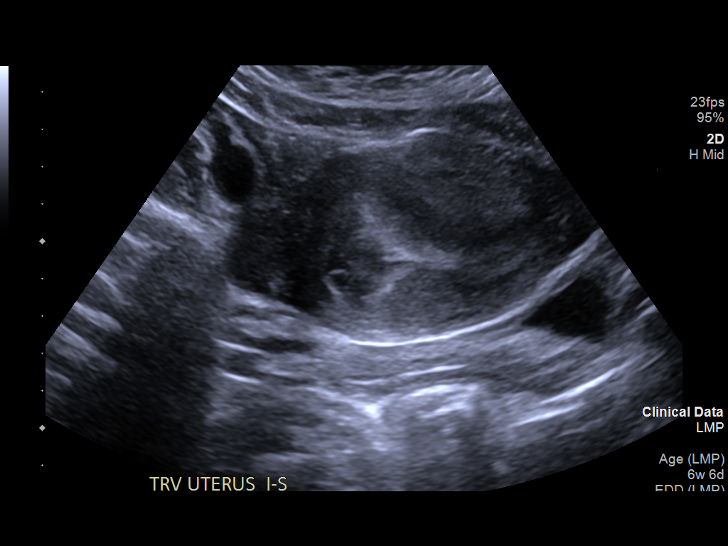
[im 16/48]
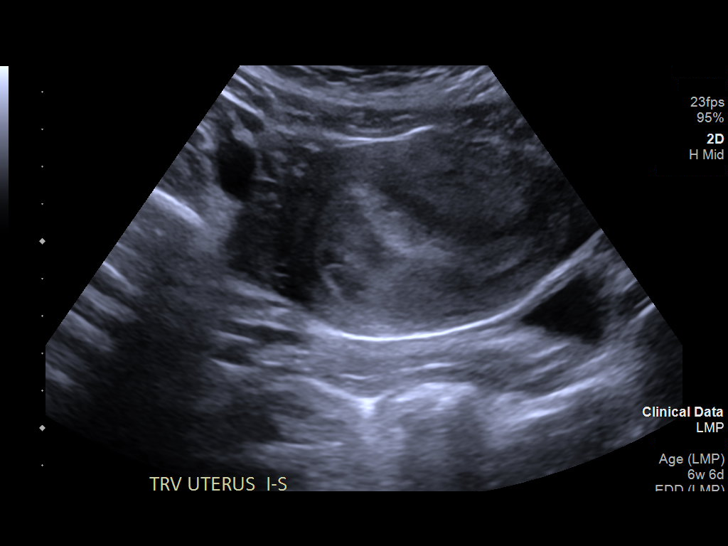
[im 20/48]
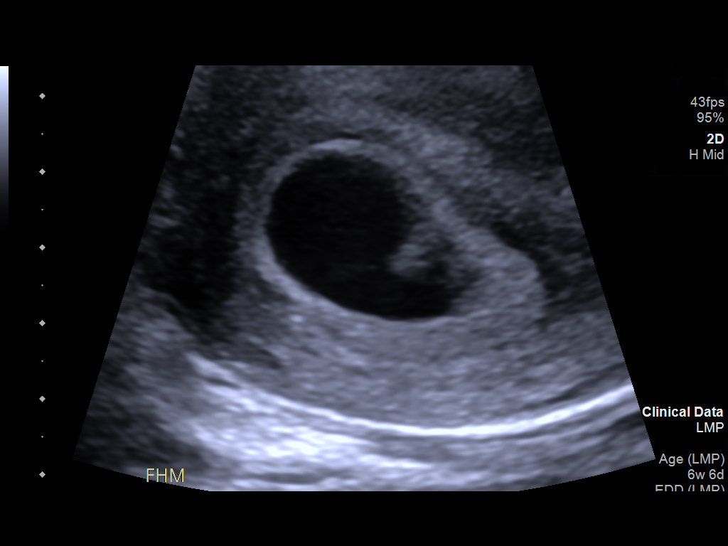
[im 23/48]
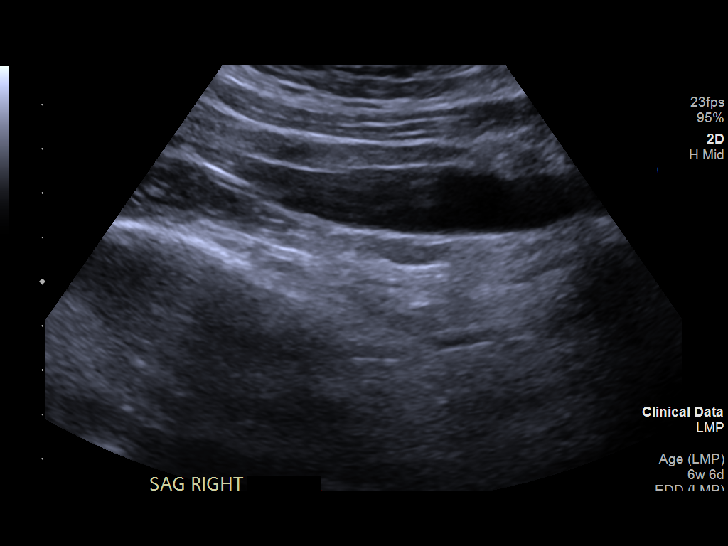
[im 27/48]
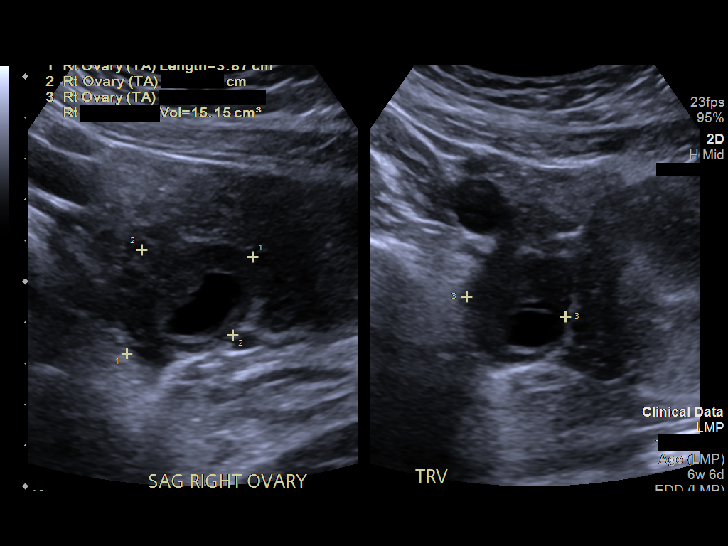
[im 30/48]
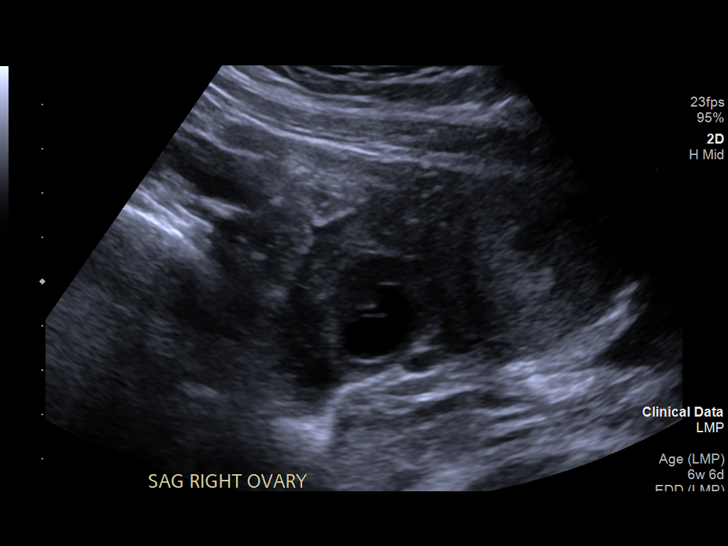
[im 34/48]
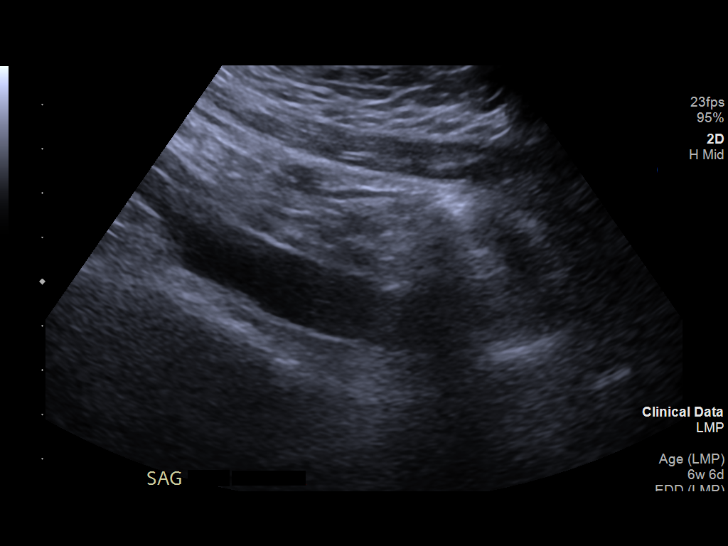
[im 37/48]
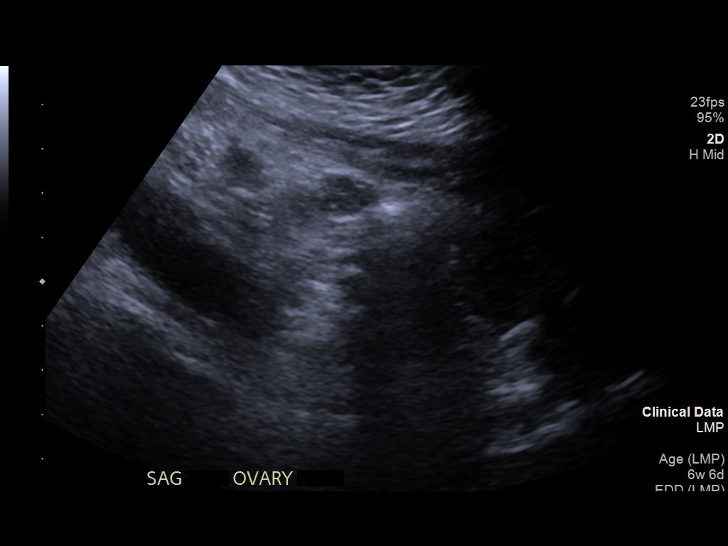
[im 41/48]
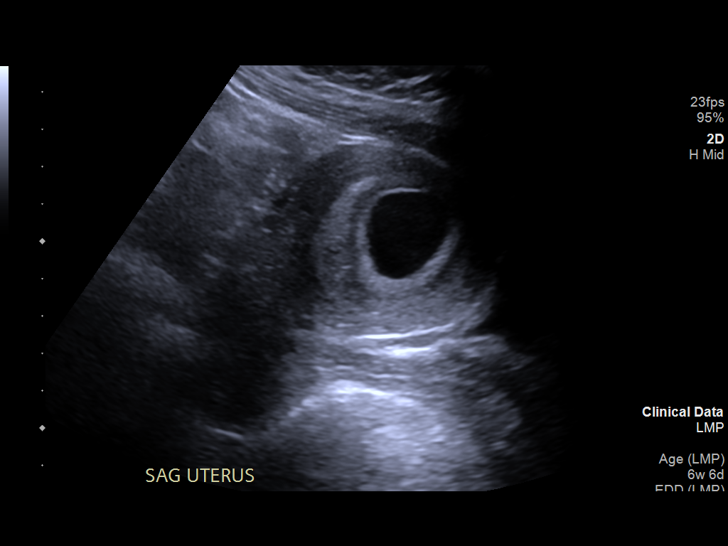
[im 44/48]
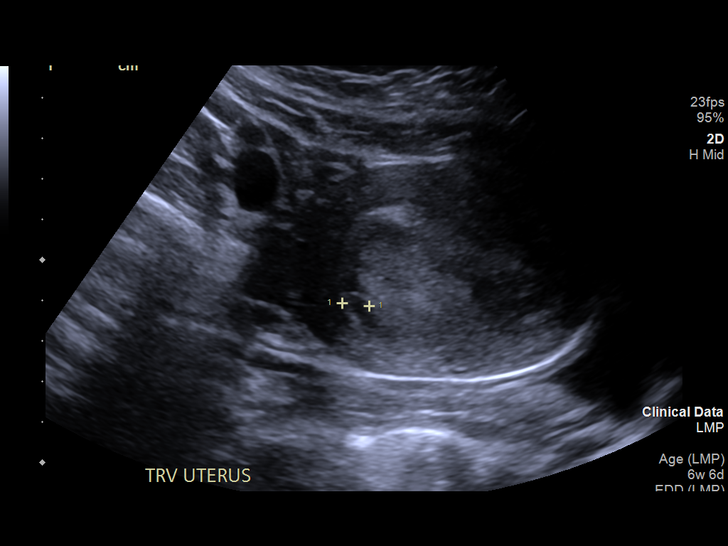
[im 48/48]
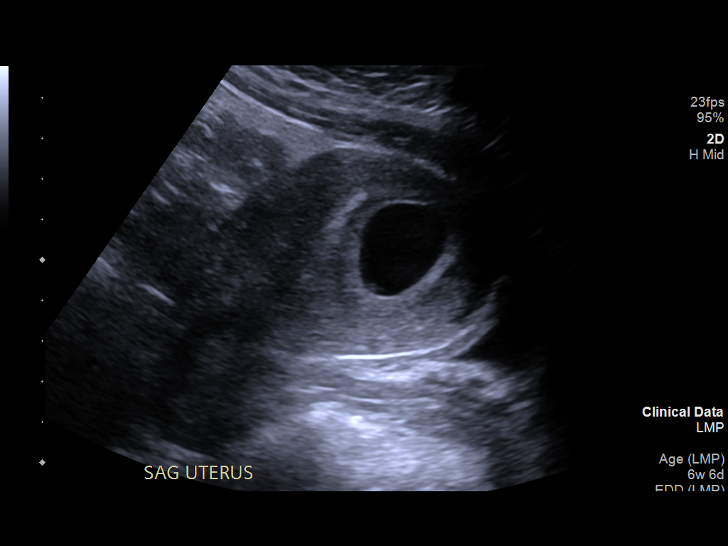

[14 of 28 positions shown; findings below may reference images not displayed]

FINDINGS: Intrauterine gestational sac: Single

Yolk sac:  Not Visualized.

Embryo:  Visualized.

Cardiac Activity: Visualized.

Heart Rate: 140 bpm

CRL:   8.1 mm   6 w 5 d                  US EDC: 03/28/2019

Subchorionic hemorrhage: Small 13 x 6 x 7 mm focus of
perigestational hemorrhage.

Maternal uterus/adnexae: The left ovary was not visualized. The
right ovary was unremarkable measuring 3.9 x 3.1 x 2.5 cm. The
uterus measured 9.4 x 5.6 x 7.1 cm.
IMPRESSION: Viable 6 week 5 day single intrauterine gestation with adjacent
small subchorionic focus of hemorrhage measuring 13 x 6 x 7 mm.

## 2021-07-02 ENCOUNTER — Ambulatory Visit
Admission: EM | Admit: 2021-07-02 | Discharge: 2021-07-02 | Disposition: A | Discharge status: Home / Self Care | Payer: 59 | Attending: Emergency Medicine | Admitting: Emergency Medicine

## 2021-07-02 ENCOUNTER — Other Ambulatory Visit: Payer: Self-pay

## 2021-07-02 DIAGNOSIS — Z113 Encounter for screening for infections with a predominantly sexual mode of transmission: Secondary | ICD-10-CM | POA: Insufficient documentation

## 2021-07-02 LAB — POCT URINALYSIS DIP (MANUAL ENTRY)
Bilirubin, UA: NEGATIVE
Blood, UA: NEGATIVE
Glucose, UA: NEGATIVE mg/dL
Ketones, POC UA: NEGATIVE mg/dL
Leukocytes, UA: NEGATIVE
Nitrite, UA: NEGATIVE
Protein Ur, POC: NEGATIVE mg/dL
Spec Grav, UA: >=1.03 — AB (ref 1.010–1.025)
Urobilinogen, UA: 0.2 E.U./dL
pH, UA: 5.5 (ref 5.0–8.0)

## 2021-07-02 LAB — POCT URINE PREGNANCY: Preg Test, Ur: NEGATIVE

## 2021-07-02 NOTE — ED Provider Notes (Signed)
[facility]    CSN: [id number] Arrival date & time: 07/02/21  1059    HISTORY  No chief complaint on file.  HPI Kathryn Clark is a 20 y.o. female. Pt c/o vaginal discharge and odor that has been going on for 4 days.  Patient states she is not having any vaginal irritation, states is not even a lot of discharge but smell is unusual.  Patient denies lowe abd/ back pain, fever, aches, chills, abdominal pain, burning with urination, genital lesion, known exposure to sexually transmitted disease.  EMR reviewed, I do not see any past medical history of STDs, vaginal Candida or bacterial vaginitis.   Past Medical History:  Diagnosis Date   Arrest of dilation, delivered, current hospitalization 03/17/2019   Status post cesarean delivery 03/17/2019   Patient Active Problem List   Diagnosis Date Noted   Arrest of dilation, delivered, current hospitalization 03/17/2019   Status post cesarean delivery 03/17/2019   Postpartum care following cesarean delivery 03/17/2019   Pre-eclampsia during pregnancy in third trimester, antepartum 03/15/2019   Morning sickness 09/23/2018   Past Surgical History:  Procedure Laterality Date   CESAREAN SECTION N/A 03/17/2019   Procedure: CESAREAN SECTION;  Surgeon: Edwinna Areola, DO;  Location: [facility];  Service: Obstetrics;  Laterality: N/A;   OB History     Gravida  1   Para  1   Term  1   Preterm      AB      Living  1      SAB      IAB      Ectopic      Multiple  0   Live Births  1          Home Medications    Prior to Admission medications   Medication Sig Start Date End Date Taking? Authorizing Provider  labetalol (NORMODYNE) 200 MG tablet Take 1 tablet (200 mg total) by mouth 2 (two) times daily. 03/20/19 08/06/19  Judeth Horn, NP  promethazine (PHENERGAN) 12.5 MG tablet Take 1 tablet (12.5 mg total) by mouth every 6 (six) hours as needed for nausea or vomiting. 09/23/18 08/06/19  Raelyn Mora, CNM   ranitidine (ZANTAC) 150 MG tablet Take 1 tablet (150 mg total) by mouth 2 (two) times daily. 09/23/18 08/06/19  Raelyn Mora, CNM   Family History Family History  Problem Relation Age of Onset   Healthy Mother    Healthy Father    Social History Social History   Tobacco Use   Smoking status: Never   Smokeless tobacco: Never  Vaping Use   Vaping Use: Never used  Substance Use Topics   Alcohol use: No   Drug use: No   Allergies   Patient has no known allergies.  Review of Systems Review of Systems Pertinent findings noted in history of present illness.   Physical Exam Triage Vital Signs ED Triage Vitals  Enc Vitals Group     BP 05/28/21 0827 (!) 147/82     Pulse Rate 05/28/21 0827 72     Resp 05/28/21 0827 18     Temp 05/28/21 0827 98.3 F (36.8 C)     Temp Source 05/28/21 0827 Oral     SpO2 05/28/21 0827 98 %     Weight --      Height --      Head Circumference --      Peak Flow --      Pain Score 05/28/21 0826 5  Pain Loc --      Pain Edu? --      Excl. in GC? --   No data found.  Updated Vital Signs BP 120/82 (BP Location: Right Arm)   Pulse 77   Temp 98.5 F (36.9 C) (Oral)   Resp 18   LMP 06/19/2021   SpO2 98%   Physical Exam Vitals and nursing note reviewed.  Constitutional:      General: She is not in acute distress.    Appearance: Normal appearance. She is not ill-appearing.  HENT:     Head: Normocephalic and atraumatic.  Eyes:     General: Lids are normal.        Right eye: No discharge.        Left eye: No discharge.     Extraocular Movements: Extraocular movements intact.     Conjunctiva/sclera: Conjunctivae normal.     Right eye: Right conjunctiva is not injected.     Left eye: Left conjunctiva is not injected.  Neck:     Trachea: Trachea and phonation normal.  Cardiovascular:     Rate and Rhythm: Normal rate and regular rhythm.     Pulses: Normal pulses.     Heart sounds: Normal heart sounds. No murmur heard.   No friction  rub. No gallop.  Pulmonary:     Effort: Pulmonary effort is normal. No accessory muscle usage, prolonged expiration or respiratory distress.     Breath sounds: Normal breath sounds. No stridor, decreased air movement or transmitted upper airway sounds. No decreased breath sounds, wheezing, rhonchi or rales.  Chest:     Chest wall: No tenderness.  Genitourinary:    Comments: Pt politely declines GU exam, pt did provide a swab for testing.   Musculoskeletal:        General: Normal range of motion.     Cervical back: Normal range of motion and neck supple. Normal range of motion.  Lymphadenopathy:     Cervical: No cervical adenopathy.  Skin:    General: Skin is warm and dry.     Findings: No erythema or rash.  Neurological:     General: No focal deficit present.     Mental Status: She is alert and oriented to person, place, and time.  Psychiatric:        Mood and Affect: Mood normal.        Behavior: Behavior normal.    Visual Acuity Right Eye Distance:   Left Eye Distance:   Bilateral Distance:    Right Eye Near:   Left Eye Near:    Bilateral Near:     UC Couse / Diagnostics / Procedures:    EKG  Radiology No results found.  Procedures Procedures (including critical care time)  UC Diagnoses / Final Clinical Impressions(s)   I have reviewed the triage vital signs and the nursing notes.  Pertinent labs & imaging results that were available during my care of the patient were reviewed by me and considered in my medical decision making (see chart for details).   Final diagnoses:  Routine screening for STI (sexually transmitted infection)   Patient advised the results of her STD testing will be posted to her MyChart should be available in the next 2 to 3 days.  Patient also advised that if she has positive results, she will be contacted by phone and treatment will be provided for her.  Patient further advised that if results are negative she will not receive a phone  call.  Disposition Upon Discharge:  Condition: stable for discharge home  Patient presents today with concerns for exposure to sexually transmitted disease, requesting testing.  STD screening was performed as indicated.  Patient has been advised that the results of screening will be made available to them via MyChart and, if there are any positive findings, they will be contacted by phone, recommendations for treatment will be advised and prescriptions will be provided as indicated based on clinical guidelines.  Patient has also been advised that if treatment is recommended, they should abstain from sexual intercourse of all forms until treatment is complete.  Patient has further been advised that once treatment is complete, they have not had a complete resolution of their symptoms, if any, they should continue to abstain from sexual intercourse with all forms and follow-up with her primary care provider or return to urgent care for repeat testing.  As such, the patient has been evaluated and assessed, work-up was performed and treatment was provided in alignment with urgent care protocols and evidence based medicine.  Patient/parent/caregiver has been advised that the patient may require follow up for further testing and/or treatment if the symptoms continue in spite of treatment, as clinically indicated and appropriate.  Routine symptom specific, illness specific and/or disease specific instructions were discussed with the patient and/or caregiver at length.  Prevention strategies for avoiding STD exposure were also discussed.  The patient will follow up with their current PCP if and as advised. If the patient does not currently have a PCP we will assist them in obtaining one.   The patient may need specialty follow up if the symptoms continue, in spite of conservative treatment and management, for further workup, evaluation, consultation and treatment as clinically indicated and  appropriate.  Patient/parent/caregiver verbalized understanding and agreement of plan as discussed.  All questions were addressed during visit.  Please see discharge instructions below for further details of plan.  ED Prescriptions   None    PDMP not reviewed this encounter.  Pending results:  Labs Reviewed  POCT URINALYSIS DIP (MANUAL ENTRY)  POCT URINE PREGNANCY  CERVICOVAGINAL ANCILLARY ONLY    Medications Ordered in UC: Medications - No data to display  Discharge Instructions: Discharge Instructions   None        Theadora Rama Scales, PA-C 07/02/21 1153

## 2021-07-02 NOTE — ED Triage Notes (Signed)
Pt c/o vaginal discharge and odor that has been going on for 4 days. Patient denies lowe abd/ back pain.

## 2021-07-05 ENCOUNTER — Telehealth (HOSPITAL_COMMUNITY): Payer: Self-pay | Admitting: Emergency Medicine

## 2021-07-05 LAB — CERVICOVAGINAL ANCILLARY ONLY
Bacterial Vaginitis (gardnerella): POSITIVE — AB
Candida Glabrata: NEGATIVE
Candida Vaginitis: NEGATIVE
Chlamydia: NEGATIVE
Comment: NEGATIVE
Comment: NEGATIVE
Comment: NEGATIVE
Comment: NEGATIVE
Comment: NEGATIVE
Comment: NORMAL
Neisseria Gonorrhea: NEGATIVE
Trichomonas: NEGATIVE

## 2021-07-05 MED ORDER — METRONIDAZOLE 500 MG PO TABS: 500.0000 mg | ORAL_TABLET | Freq: Two times a day (BID) | ORAL | 0 refills | Status: DC

## 2021-07-07 ENCOUNTER — Other Ambulatory Visit: Payer: Self-pay

## 2021-07-07 ENCOUNTER — Ambulatory Visit
Admission: EM | Admit: 2021-07-07 | Discharge: 2021-07-07 | Disposition: A | Discharge status: Other Acute Inpatient Hospital | Payer: 59

## 2021-07-09 ENCOUNTER — Ambulatory Visit
Admission: EM | Admit: 2021-07-09 | Discharge: 2021-07-09 | Disposition: A | Discharge status: Home / Self Care | Payer: 59 | Attending: Emergency Medicine | Admitting: Emergency Medicine

## 2021-07-09 ENCOUNTER — Other Ambulatory Visit: Payer: Self-pay

## 2021-07-09 VITALS — BP 115/75 | HR 82 | Temp 98.3°F | Resp 16

## 2021-07-09 DIAGNOSIS — Z113 Encounter for screening for infections with a predominantly sexual mode of transmission: Secondary | ICD-10-CM | POA: Diagnosis not present

## 2021-07-09 NOTE — Discharge Instructions (Addendum)
You will be notified of the results of your STD testing once complete.  If there are any further recommendations based on your results, this will be provided for you.

## 2021-07-09 NOTE — ED Triage Notes (Signed)
Pt reports she wants to be tested for STDs (only blood work) for HIV and Syphilis. Patient denies having any symptoms.

## 2021-07-09 NOTE — ED Provider Notes (Signed)
[facility]    CSN: [id number] Arrival date & time: 07/09/21  1024    HISTORY  No chief complaint on file.  HPI Kathryn Clark is a 20 y.o. female. Patient presents today requesting screening for HIV and syphilis.  Patient denies known contacts or any concerns for infection.  Patient states she just has not had screening performed in a while.  Patient denies genital lesions, rash, recurrent or unusual infections, skin lesions, fatigue.  The history is provided by the patient.  Past Medical History:  Diagnosis Date   Arrest of dilation, delivered, current hospitalization 03/17/2019   Status post cesarean delivery 03/17/2019   Patient Active Problem List   Diagnosis Date Noted   Arrest of dilation, delivered, current hospitalization 03/17/2019   Status post cesarean delivery 03/17/2019   Postpartum care following cesarean delivery 03/17/2019   Pre-eclampsia during pregnancy in third trimester, antepartum 03/15/2019   Morning sickness 09/23/2018   Past Surgical History:  Procedure Laterality Date   CESAREAN SECTION N/A 03/17/2019   Procedure: CESAREAN SECTION;  Surgeon: Edwinna Areola, DO;  Location: [facility];  Service: Obstetrics;  Laterality: N/A;   OB History     Gravida  1   Para  1   Term  1   Preterm      AB      Living  1      SAB      IAB      Ectopic      Multiple  0   Live Births  1          Home Medications    Prior to Admission medications   Medication Sig Start Date End Date Taking? Authorizing Provider  metroNIDAZOLE (FLAGYL) 500 MG tablet Take 1 tablet (500 mg total) by mouth 2 (two) times daily. 07/05/21   LampteyBritta Mccreedy, MD  labetalol (NORMODYNE) 200 MG tablet Take 1 tablet (200 mg total) by mouth 2 (two) times daily. 03/20/19 08/06/19  Judeth Horn, NP  promethazine (PHENERGAN) 12.5 MG tablet Take 1 tablet (12.5 mg total) by mouth every 6 (six) hours as needed for nausea or vomiting. 09/23/18 08/06/19  Raelyn Mora,  CNM  ranitidine (ZANTAC) 150 MG tablet Take 1 tablet (150 mg total) by mouth 2 (two) times daily. 09/23/18 08/06/19  Raelyn Mora, CNM   Family History Family History  Problem Relation Age of Onset   Healthy Mother    Healthy Father    Social History Social History   Tobacco Use   Smoking status: Never   Smokeless tobacco: Never  Vaping Use   Vaping Use: Never used  Substance Use Topics   Alcohol use: No   Drug use: No   Allergies   Patient has no known allergies.  Review of Systems Review of Systems Pertinent findings noted in history of present illness.   Physical Exam Triage Vital Signs ED Triage Vitals  Enc Vitals Group     BP 05/28/21 0827 (!) 147/82     Pulse Rate 05/28/21 0827 72     Resp 05/28/21 0827 18     Temp 05/28/21 0827 98.3 F (36.8 C)     Temp Source 05/28/21 0827 Oral     SpO2 05/28/21 0827 98 %     Weight --      Height --      Head Circumference --      Peak Flow --      Pain Score 05/28/21 0826 5  Pain Loc --      Pain Edu? --      Excl. in GC? --   No data found.  Updated Vital Signs BP 115/75 (BP Location: Left Arm)   Pulse 82   Temp 98.3 F (36.8 C) (Oral)   Resp 16   LMP 06/19/2021   SpO2 98%   Physical Exam Vitals and nursing note reviewed.  Constitutional:      General: She is not in acute distress.    Appearance: Normal appearance. She is not ill-appearing.  HENT:     Head: Normocephalic and atraumatic.  Eyes:     General: Lids are normal.        Right eye: No discharge.        Left eye: No discharge.     Extraocular Movements: Extraocular movements intact.     Conjunctiva/sclera: Conjunctivae normal.     Right eye: Right conjunctiva is not injected.     Left eye: Left conjunctiva is not injected.  Neck:     Trachea: Trachea and phonation normal.  Cardiovascular:     Rate and Rhythm: Normal rate and regular rhythm.     Pulses: Normal pulses.     Heart sounds: Normal heart sounds. No murmur heard.   No  friction rub. No gallop.  Pulmonary:     Effort: Pulmonary effort is normal. No accessory muscle usage, prolonged expiration or respiratory distress.     Breath sounds: Normal breath sounds. No stridor, decreased air movement or transmitted upper airway sounds. No decreased breath sounds, wheezing, rhonchi or rales.  Chest:     Chest wall: No tenderness.  Musculoskeletal:        General: Normal range of motion.     Cervical back: Normal range of motion and neck supple. Normal range of motion.  Lymphadenopathy:     Cervical: No cervical adenopathy.  Skin:    General: Skin is warm and dry.     Findings: No erythema or rash.  Neurological:     General: No focal deficit present.     Mental Status: She is alert and oriented to person, place, and time.  Psychiatric:        Mood and Affect: Mood normal.        Behavior: Behavior normal.    Visual Acuity Right Eye Distance:   Left Eye Distance:   Bilateral Distance:    Right Eye Near:   Left Eye Near:    Bilateral Near:     UC Couse / Diagnostics / Procedures:    EKG  Radiology No results found.  Procedures Procedures (including critical care time)  UC Diagnoses / Final Clinical Impressions(s)   I have reviewed the triage vital signs and the nursing notes.  Pertinent labs & imaging results that were available during my care of the patient were reviewed by me and considered in my medical decision making (see chart for details).   Final diagnoses:  Screening examination for sexually transmitted disease   Screening performed at patient's request.  Patient advised to be notified of results once received and if any treatment is indicated, she will be directed.  ED Prescriptions   None    PDMP not reviewed this encounter.  Pending results:  Labs Reviewed  HIV ANTIBODY (ROUTINE TESTING W REFLEX)  RPR    Medications Ordered in UC: Medications - No data to display  Disposition Upon Discharge:  Condition: stable for  discharge home  Patient presents today with concerns for  exposure to sexually transmitted disease, requesting testing.  STD screening was performed as indicated.  Patient has been advised that the results of screening will be made available to them via MyChart and, if there are any positive findings, they will be contacted by phone, recommendations for treatment will be advised and prescriptions will be provided as indicated based on clinical guidelines.  Patient has also been advised that if treatment is recommended, they should abstain from sexual intercourse of all forms until treatment is complete.  Patient has further been advised that once treatment is complete, they have not had a complete resolution of their symptoms, if any, they should continue to abstain from sexual intercourse with all forms and follow-up with her primary care provider or return to urgent care for repeat testing.  As such, the patient has been evaluated and assessed, work-up was performed and treatment was provided in alignment with urgent care protocols and evidence based medicine.  Patient/parent/caregiver has been advised that the patient may require follow up for further testing and/or treatment if the symptoms continue in spite of treatment, as clinically indicated and appropriate.  Routine symptom specific, illness specific and/or disease specific instructions were discussed with the patient and/or caregiver at length.  Prevention strategies for avoiding STD exposure were also discussed.  The patient will follow up with their current PCP if and as advised. If the patient does not currently have a PCP we will assist them in obtaining one.   The patient may need specialty follow up if the symptoms continue, in spite of conservative treatment and management, for further workup, evaluation, consultation and treatment as clinically indicated and appropriate.  Patient/parent/caregiver verbalized understanding and agreement of  plan as discussed.  All questions were addressed during visit.  Please see discharge instructions below for further details of plan.  Discharge Instructions:   Discharge Instructions      You will be notified of the results of your STD testing once complete.  If there are any further recommendations based on your results, this will be provided for you.         Theadora Rama Scales, PA-C 07/09/21 1544

## 2021-07-10 LAB — HIV ANTIBODY (ROUTINE TESTING W REFLEX): HIV Screen 4th Generation wRfx: NONREACTIVE

## 2021-07-10 LAB — RPR: RPR Ser Ql: NONREACTIVE

## 2021-08-08 ENCOUNTER — Encounter (HOSPITAL_COMMUNITY): Payer: Self-pay | Admitting: *Deleted

## 2021-08-08 ENCOUNTER — Other Ambulatory Visit: Payer: Self-pay

## 2021-08-08 ENCOUNTER — Ambulatory Visit (HOSPITAL_COMMUNITY)
Admission: EM | Admit: 2021-08-08 | Discharge: 2021-08-08 | Disposition: A | Discharge status: Home / Self Care | Payer: 59 | Attending: Internal Medicine | Admitting: Internal Medicine

## 2021-08-08 DIAGNOSIS — N898 Other specified noninflammatory disorders of vagina: Secondary | ICD-10-CM | POA: Diagnosis not present

## 2021-08-08 LAB — POCT URINALYSIS DIPSTICK, ED / UC
Bilirubin Urine: NEGATIVE
Glucose, UA: NEGATIVE mg/dL
Hgb urine dipstick: NEGATIVE
Ketones, ur: NEGATIVE mg/dL
Nitrite: NEGATIVE
Protein, ur: NEGATIVE mg/dL
Specific Gravity, Urine: 1.025 (ref 1.005–1.030)
Urobilinogen, UA: 0.2 mg/dL (ref 0.0–1.0)
pH: 6 (ref 5.0–8.0)

## 2021-08-08 LAB — POC URINE PREG, ED: Preg Test, Ur: NEGATIVE

## 2021-08-08 MED ORDER — TINIDAZOLE 500 MG PO TABS: 2.0000 g | ORAL_TABLET | Freq: Once | ORAL | 0 refills | Status: AC

## 2021-08-08 NOTE — ED Provider Notes (Signed)
[facility]    CSN: [id number] Arrival date & time: 08/08/21  1709      History   Chief Complaint Chief Complaint  Patient presents with   Vaginal Discharge    HPI Kathryn FredericksonJaniyah Clark is a 21 y.o. female.   Pleasant 21 year old female presents today with concerns of vaginal discharge.  She states she has had bacterial vaginosis in the past and this feels very similar.  Her last bout of BV was 1 month ago in which she took 1 week of metronidazole.  She states that resolved but over the past couple days it has returned.  She reports a fishy discharge with some vaginal itching.  She states she only has 1 partner and is the same partner she has had for a while.  She denies any bleeding, dysuria, pelvic pain.  She denies any fever, abdominal symptoms, nausea, vomiting.   Vaginal Discharge  Past Medical History:  Diagnosis Date   Arrest of dilation, delivered, current hospitalization 03/17/2019   Status post cesarean delivery 03/17/2019    Patient Active Problem List   Diagnosis Date Noted   Arrest of dilation, delivered, current hospitalization 03/17/2019   Status post cesarean delivery 03/17/2019   Postpartum care following cesarean delivery 03/17/2019   Pre-eclampsia during pregnancy in third trimester, antepartum 03/15/2019   Morning sickness 09/23/2018    Past Surgical History:  Procedure Laterality Date   CESAREAN SECTION N/A 03/17/2019   Procedure: CESAREAN SECTION;  Surgeon: Edwinna AreolaBanga, Cecilia Worema, DO;  Location: [facility];  Service: Obstetrics;  Laterality: N/A;    OB History     Gravida  1   Para  1   Term  1   Preterm      AB      Living  1      SAB      IAB      Ectopic      Multiple  0   Live Births  1            Home Medications    Prior to Admission medications   Medication Sig Start Date End Date Taking? Authorizing Provider  tinidazole (TINDAMAX) 500 MG tablet Take 4 tablets (2,000 mg total) by mouth once for 1 dose. 08/08/21  08/08/21 Yes Kamilla Hands L, PA  labetalol (NORMODYNE) 200 MG tablet Take 1 tablet (200 mg total) by mouth 2 (two) times daily. 03/20/19 08/06/19  Judeth HornLawrence, Erin, NP  promethazine (PHENERGAN) 12.5 MG tablet Take 1 tablet (12.5 mg total) by mouth every 6 (six) hours as needed for nausea or vomiting. 09/23/18 08/06/19  Raelyn Moraawson, Rolitta, CNM  ranitidine (ZANTAC) 150 MG tablet Take 1 tablet (150 mg total) by mouth 2 (two) times daily. 09/23/18 08/06/19  Raelyn Moraawson, Rolitta, CNM    Family History Family History  Problem Relation Age of Onset   Healthy Mother    Healthy Father     Social History Social History   Tobacco Use   Smoking status: Never   Smokeless tobacco: Never  Vaping Use   Vaping Use: Never used  Substance Use Topics   Alcohol use: No   Drug use: No     Allergies   Patient has no known allergies.   Review of Systems Review of Systems  Genitourinary:  Positive for vaginal discharge.  All other systems reviewed and are negative.   Physical Exam Triage Vital Signs ED Triage Vitals  Enc Vitals Group     BP 08/08/21 1719 133/89  Pulse Rate 08/08/21 1719 91     Resp 08/08/21 1719 18     Temp 08/08/21 1719 98.3 F (36.8 C)     Temp src --      SpO2 08/08/21 1719 99 %     Weight --      Height --      Head Circumference --      Peak Flow --      Pain Score 08/08/21 1717 0     Pain Loc --      Pain Edu? --      Excl. in GC? --    No data found.  Updated Vital Signs BP 133/89    Pulse 91    Temp 98.3 F (36.8 C)    Resp 18    LMP 07/22/2021    SpO2 99%   Visual Acuity Right Eye Distance:   Left Eye Distance:   Bilateral Distance:    Right Eye Near:   Left Eye Near:    Bilateral Near:     Physical Exam Vitals and nursing note reviewed.  Constitutional:      General: She is not in acute distress.    Appearance: She is well-developed.  HENT:     Head: Normocephalic and atraumatic.  Eyes:     Conjunctiva/sclera: Conjunctivae normal.  Cardiovascular:      Rate and Rhythm: Normal rate and regular rhythm.     Heart sounds: No murmur heard. Pulmonary:     Effort: Pulmonary effort is normal. No respiratory distress.     Breath sounds: Normal breath sounds.  Abdominal:     Palpations: Abdomen is soft.     Tenderness: There is no abdominal tenderness.  Genitourinary:    Comments: PELVIC EXAM OFFERED - PATIENT DECLINED Musculoskeletal:        General: No swelling.     Cervical back: Neck supple.  Skin:    General: Skin is warm and dry.     Capillary Refill: Capillary refill takes less than 2 seconds.  Neurological:     Mental Status: She is alert.  Psychiatric:        Mood and Affect: Mood normal.     UC Treatments / Results  Labs (all labs ordered are listed, but only abnormal results are displayed) Labs Reviewed  POCT URINALYSIS DIPSTICK, ED / UC - Abnormal; Notable for the following components:      Result Value   Leukocytes,Ua SMALL (*)    All other components within normal limits  POC URINE PREG, ED  CERVICOVAGINAL ANCILLARY ONLY    EKG   Radiology No results found.  Procedures Procedures (including critical care time)  Medications Ordered in UC Medications - No data to display  Initial Impression / Assessment and Plan / UC Course  I have reviewed the triage vital signs and the nursing notes.  Pertinent labs & imaging results that were available during my care of the patient were reviewed by me and considered in my medical decision making (see chart for details).     Vaginitis - pt with known hx of BV in the past. Will do tx with tinidazole which would cover for BV and trich. Avoid all forms of intercourse until results obtained. All questions/ concerns addressed.  Final Clinical Impressions(s) / UC Diagnoses   Final diagnoses:  Vaginal discharge     Discharge Instructions      Please take 4 tabs of tinidazole at one time for a single dose Please avoid all  forms of intercourse until your test results  are received.  If positive for trichomonas, your partner will also need to be treated. Additional medications will be called in for you pending the results of the test, as needed.     ED Prescriptions     Medication Sig Dispense Auth. Provider   tinidazole (TINDAMAX) 500 MG tablet Take 4 tablets (2,000 mg total) by mouth once for 1 dose. 4 tablet Fredrico Beedle L, [place]      PDMP not reviewed this encounter.   Maretta Bees, [place] 08/08/21 1809

## 2021-08-08 NOTE — ED Triage Notes (Signed)
Pt reports vag. Discharge fro 2 days with a odor.

## 2021-08-08 NOTE — Discharge Instructions (Signed)
Please take 4 tabs of tinidazole at one time for a single dose Please avoid all forms of intercourse until your test results are received.  If positive for trichomonas, your partner will also need to be treated. Additional medications will be called in for you pending the results of the test, as needed.

## 2021-08-09 LAB — CERVICOVAGINAL ANCILLARY ONLY
Bacterial Vaginitis (gardnerella): POSITIVE — AB
Candida Glabrata: NEGATIVE
Candida Vaginitis: NEGATIVE
Chlamydia: NEGATIVE
Comment: NEGATIVE
Comment: NEGATIVE
Comment: NEGATIVE
Comment: NEGATIVE
Comment: NEGATIVE
Comment: NORMAL
Neisseria Gonorrhea: NEGATIVE
Trichomonas: NEGATIVE

## 2021-09-06 ENCOUNTER — Ambulatory Visit
Admission: RE | Admit: 2021-09-06 | Discharge: 2021-09-06 | Disposition: A | Discharge status: Home / Self Care | Payer: 59 | Source: Ambulatory Visit | Attending: Emergency Medicine | Admitting: Emergency Medicine

## 2021-09-06 ENCOUNTER — Other Ambulatory Visit: Payer: Self-pay

## 2021-09-06 DIAGNOSIS — H109 Unspecified conjunctivitis: Secondary | ICD-10-CM

## 2021-09-06 MED ORDER — POLYMYXIN B-TRIMETHOPRIM 10000-0.1 UNIT/ML-% OP SOLN: 2.0000 [drp] | OPHTHALMIC | 0 refills | Status: AC

## 2021-09-06 MED ORDER — OLOPATADINE HCL 0.1 % OP SOLN: 1.0000 [drp] | Freq: Two times a day (BID) | OPHTHALMIC | 0 refills | Status: DC

## 2021-09-06 NOTE — Discharge Instructions (Addendum)
Begin trimethoprim polymyxin eyedrops, 2 drops in each eye every 3 hours while awake.  I have added a second medication which is an antihistamine drop which you can add if you are also having discomfort in your eye after completing Polytrim.  You can place 1 drop in each eye twice daily as needed.  Thank you for visiting urgent care today.  Please follow-up with your primary care provider or with ophthalmology if you do not have significant improvement of your symptoms 7 days.

## 2021-09-06 NOTE — ED Triage Notes (Signed)
Patient presents to [facility] for evaluation of left eye redness and swelling x 2 days, worse after warm compress, patient's son recently had pink eye.

## 2021-09-06 NOTE — ED Provider Notes (Signed)
[facility]    CSN: [id number] Arrival date & time: 09/06/21  1224    HISTORY   Chief Complaint  Patient presents with   Eye Problem   APPT 1230   HPI Kathryn Clark is a 21 y.o. female. Patient complains of left eye redness and swelling for 2 days, patient states the redness and swelling is gotten worse with warm compresses.  Patient states that her son recently had pinkeye.  The history is provided by the patient.  Past Medical History:  Diagnosis Date   Arrest of dilation, delivered, current hospitalization 03/17/2019   Status post cesarean delivery 03/17/2019   Patient Active Problem List   Diagnosis Date Noted   Arrest of dilation, delivered, current hospitalization 03/17/2019   Status post cesarean delivery 03/17/2019   Postpartum care following cesarean delivery 03/17/2019   Pre-eclampsia during pregnancy in third trimester, antepartum 03/15/2019   Morning sickness 09/23/2018   Past Surgical History:  Procedure Laterality Date   CESAREAN SECTION N/A 03/17/2019   Procedure: CESAREAN SECTION;  Surgeon: Edwinna Areola, DO;  [facility]: [facility];  Service: Obstetrics;  Laterality: N/A;   OB History     Gravida  1   Para  1   Term  1   Preterm      AB      Living  1      SAB      IAB      Ectopic      Multiple  0   Live Births  1          Home Medications    Prior to Admission medications   Medication Sig Start Date End Date Taking? Authorizing Provider   Family History Family History  Problem Relation Age of Onset   Healthy Mother    Healthy Father    Social History Social History   Tobacco Use   Smoking status: Never   Smokeless tobacco: Never  Vaping Use   Vaping Use: Never used  Substance Use Topics   Alcohol use: No   Drug use: No   Allergies   Patient has no known allergies.  Review of Systems Review of Systems Pertinent findings noted in history of present illness.   Physical Exam Triage Vital  Signs ED Triage Vitals  Enc Vitals Group     BP 05/28/21 0827 (!) 147/82     Pulse Rate 05/28/21 0827 72     Resp 05/28/21 0827 18     Temp 05/28/21 0827 98.3 F (36.8 C)     Temp Source 05/28/21 0827 Oral     SpO2 05/28/21 0827 98 %     Weight --      Height --      Head Circumference --      Peak Flow --      Pain Score 05/28/21 0826 5     Pain Loc --      Pain Edu? --      Excl. in GC? --   No data found.  Updated Vital Signs LMP 08/16/2021   Physical Exam Vitals and nursing note reviewed.  Constitutional:      General: She is not in acute distress.    Appearance: Normal appearance. She is not ill-appearing.  HENT:     Head: Normocephalic and atraumatic.     Salivary Glands: Right salivary gland is not diffusely enlarged or tender. Left salivary gland is not diffusely enlarged or tender.     Right  Ear: Tympanic membrane, ear canal and external ear normal. No drainage. No middle ear effusion. There is no impacted cerumen. Tympanic membrane is not erythematous or bulging.     Left Ear: Tympanic membrane, ear canal and external ear normal. No drainage.  No middle ear effusion. There is no impacted cerumen. Tympanic membrane is not erythematous or bulging.     Nose: Nose normal. No nasal deformity, septal deviation, mucosal edema, congestion or rhinorrhea.     Right Turbinates: Not enlarged, swollen or pale.     Left Turbinates: Not enlarged, swollen or pale.     Right Sinus: No maxillary sinus tenderness or frontal sinus tenderness.     Left Sinus: No maxillary sinus tenderness or frontal sinus tenderness.     Mouth/Throat:     Lips: Pink. No lesions.     Mouth: Mucous membranes are moist. No oral lesions.     Pharynx: Oropharynx is clear. Uvula midline. No posterior oropharyngeal erythema or uvula swelling.     Tonsils: No tonsillar exudate. 0 on the right. 0 on the left.  Eyes:     General: Lids are normal. Lids are everted, no foreign bodies appreciated. Vision grossly  intact. Gaze aligned appropriately.        Right eye: No foreign body, discharge or hordeolum.        Left eye: No foreign body, discharge or hordeolum.     Extraocular Movements: Extraocular movements intact.     Conjunctiva/sclera:     Right eye: Right conjunctiva is not injected. No exudate.    Left eye: Left conjunctiva is injected. Exudate present.  Neck:     Trachea: Trachea and phonation normal.  Cardiovascular:     Rate and Rhythm: Normal rate and regular rhythm.     Pulses: Normal pulses.     Heart sounds: Normal heart sounds. No murmur heard.   No friction rub. No gallop.  Pulmonary:     Effort: Pulmonary effort is normal. No accessory muscle usage, prolonged expiration or respiratory distress.     Breath sounds: Normal breath sounds. No stridor, decreased air movement or transmitted upper airway sounds. No decreased breath sounds, wheezing, rhonchi or rales.  Chest:     Chest wall: No tenderness.  Musculoskeletal:        General: Normal range of motion.     Cervical back: Normal range of motion and neck supple. Normal range of motion.  Lymphadenopathy:     Cervical: No cervical adenopathy.  Skin:    General: Skin is warm and dry.     Findings: No erythema or rash.  Neurological:     General: No focal deficit present.     Mental Status: She is alert and oriented to person, place, and time.  Psychiatric:        Mood and Affect: Mood normal.        Behavior: Behavior normal.    Visual Acuity Right Eye Distance:   Left Eye Distance:   Bilateral Distance:    Right Eye Near:   Left Eye Near:    Bilateral Near:     [facility] Couse / Diagnostics / Procedures:    EKG  Radiology No results found.  Procedures Procedures (including critical care time)  UC Diagnoses / Final Clinical Impressions(s)   I have reviewed the triage vital signs and the nursing notes.  Pertinent labs & imaging results that were available during my care of the patient were reviewed by me and  considered in my  medical decision making (see chart for details).   Final diagnoses:  Bacterial conjunctivitis of left eye   Patient advised again Polytrim, Pataday added for comfort.  Patient advised to follow-up with PCP or ophthalmology if no improvement after 7 days.  ED Prescriptions     Medication Sig Dispense Auth. Provider   trimethoprim-polymyxin b (POLYTRIM) ophthalmic solution Place 2 drops into both eyes every 3 (three) hours while awake for 7 days. 10 mL Theadora RamaMorgan, Shota Kohrs Scales, PA-C   olopatadine (PATADAY) 0.1 % ophthalmic solution Place 1 drop into both eyes 2 (two) times daily. 5 mL Theadora RamaMorgan, Marshal Eskew Scales, PA-C      PDMP not reviewed this encounter.  Pending results:  Labs Reviewed - No data to display  Medications Ordered in UC: Medications - No data to display  Disposition Upon Discharge:  Condition: stable for discharge home Home: take medications as prescribed; routine discharge instructions as discussed; follow up as advised.  Patient presented with an acute illness with associated systemic symptoms and significant discomfort requiring urgent management. In my opinion, this is a condition that a prudent lay person (someone who possesses an average knowledge of health and medicine) may potentially expect to result in complications if not addressed urgently such as respiratory distress, impairment of bodily function or dysfunction of bodily organs.   Routine symptom specific, illness specific and/or disease specific instructions were discussed with the patient and/or caregiver at length.   As such, the patient has been evaluated and assessed, work-up was performed and treatment was provided in alignment with urgent care protocols and evidence based medicine.  Patient/parent/caregiver has been advised that the patient may require follow up for further testing and treatment if the symptoms continue in spite of treatment, as clinically indicated and appropriate.  If the  patient was tested for COVID-19, Influenza and/or RSV, then the patient/parent/guardian was advised to isolate at home pending the results of his/her diagnostic coronavirus test and potentially longer if theyre positive. I have also advised pt that if his/her COVID-19 test returns positive, it's recommended to self-isolate for at least 10 days after symptoms first appeared AND until fever-free for 24 hours without fever reducer AND other symptoms have improved or resolved. Discussed self-isolation recommendations as well as instructions for household member/close contacts as per the [facility] and [place] DHHS, and also gave patient the COVID packet with this information.  Patient/parent/caregiver has been [facility] 3-5 days if no better; to PCP or the Emergency Department if new signs and symptoms develop, or if the current signs or symptoms continue to change or worsen for further workup, evaluation and treatment as clinically indicated and appropriate  The patient will follow up with their current PCP if and as advised. If the patient does not currently have a PCP we will assist them in obtaining one.   The patient may need specialty follow up if the symptoms continue, in spite of conservative treatment and management, for further workup, evaluation, consultation and treatment as clinically indicated and appropriate.  Patient/parent/caregiver verbalized understanding and agreement of plan as discussed.  All questions were addressed during visit.  Please see discharge instructions below for further details of plan.  Discharge Instructions:   Discharge Instructions      Begin trimethoprim polymyxin eyedrops, 2 drops in each eye every 3 hours while awake.  I have added a second medication which is an antihistamine drop which you can add if you are also having discomfort in your  eye after completing Polytrim.  You can place 1 drop in each eye twice daily as needed.  Thank you for  visiting urgent care today.  Please follow-up with your primary care provider or with ophthalmology if you do not have significant improvement of your symptoms 7 days.    This office note has been dictated using Teaching laboratory technician.  Unfortunately, and despite my best efforts, this method of dictation can sometimes lead to occasional typographical or grammatical errors.  I apologize in advance if this occurs.     Theadora Rama Scales, PA-C 09/06/21 1300

## 2021-09-10 ENCOUNTER — Ambulatory Visit: Payer: Self-pay

## 2021-09-11 ENCOUNTER — Emergency Department (HOSPITAL_COMMUNITY)
Admission: EM | Admit: 2021-09-11 | Discharge: 2021-09-11 | Disposition: A | Discharge status: Home / Self Care | Payer: 59 | Attending: Emergency Medicine | Admitting: Emergency Medicine

## 2021-09-11 ENCOUNTER — Encounter (HOSPITAL_COMMUNITY): Payer: Self-pay | Admitting: *Deleted

## 2021-09-11 ENCOUNTER — Other Ambulatory Visit: Payer: Self-pay

## 2021-09-11 DIAGNOSIS — H538 Other visual disturbances: Secondary | ICD-10-CM | POA: Diagnosis present

## 2021-09-11 DIAGNOSIS — H1033 Unspecified acute conjunctivitis, bilateral: Secondary | ICD-10-CM | POA: Insufficient documentation

## 2021-09-11 DIAGNOSIS — R0981 Nasal congestion: Secondary | ICD-10-CM | POA: Insufficient documentation

## 2021-09-11 MED ORDER — ERYTHROMYCIN 5 MG/GM OP OINT: TOPICAL_OINTMENT | OPHTHALMIC | 0 refills | Status: DC

## 2021-09-11 MED ORDER — FLUORESCEIN SODIUM 1 MG OP STRP
1.0000 | ORAL_STRIP | Freq: Once | OPHTHALMIC | Status: AC
  Administered 2021-09-11: 1 via OPHTHALMIC
  Filled 2021-09-11: qty 1

## 2021-09-11 MED ORDER — TETRACAINE HCL 0.5 % OP SOLN
2.0000 [drp] | Freq: Once | OPHTHALMIC | Status: AC
  Administered 2021-09-11: 2 [drp] via OPHTHALMIC
  Filled 2021-09-11: qty 4

## 2021-09-11 NOTE — ED Triage Notes (Signed)
Lt eye red and swollen for one week  her son had the same first  she has been to urgent cate and was given some drops that  has not helped  lmp last month

## 2021-09-11 NOTE — Discharge Instructions (Addendum)
Seen and evaluated in our emergency department.  I suspect that you have allergic conjunctivitis.  I will place follow-up order for outpatient ophthalmology follow-up.  Please call the number attached.  I will also discharge you with a prescription for some.

## 2021-09-11 NOTE — ED Provider Triage Note (Signed)
Emergency Medicine Provider Triage Evaluation Note  Kathryn Clark , a 21 y.o. female  was evaluated in triage.  Pt complains of eye pain.  She reports that she has been using the polytrim since the 6th with out any relief. Her son had similar but got better.   She reports her vision is blurry.    Physical Exam  BP 124/77    Pulse 71    Temp 98.5 F (36.9 C) (Oral)    Resp 14    Ht 5\' 4"  (1.626 m)    Wt 91.2 kg    LMP 08/16/2021    SpO2 100%    BMI 34.51 kg/m  Gen:   Awake, no distress   Resp:  Normal effort  MSK:   Moves extremities without difficulty  Other:  Left eye is injected.    Medical Decision Making  Medically screening exam initiated at 5:23 PM.  Appropriate orders placed.  Marchele Decock was informed that the remainder of the evaluation will be completed by another provider, this initial triage assessment does not replace that evaluation, and the importance of remaining in the ED until their evaluation is complete.     Tish Frederickson, Cristina Gong 09/11/21 1726

## 2021-09-11 NOTE — ED Provider Notes (Signed)
[facility] Emergency Department Provider Note MRN:  [id number]  Arrival date & time: 09/11/21     Chief Complaint   Eye Problem and Eye Pain   History of Present Illness   Kathryn Clark is a 21 y.o. year-old female no significant past medical history presenting to the ED with chief complaint of left eye injection.  Patient reports that she has had eye reddening since the sixth of this month.  5 days.  During this time she has attempted to use Polytrim eyedrops but have not resulted in relief.  The patient states that her son had similar symptoms and received eyedrops and this resolved his symptoms.  The patient told me that she is not experiencing eye pain.  He does not have pain with extraocular movements.  She states that her vision was blurred due to copious secretion but when there is no water in her eyes her vision is intact.  She states that she has had no fevers or chills.  She states that she had a runny nose that was associated but no cough, shortness of breath, chest pain, headache, or neck stiffness.  Review of Systems  A thorough review of systems was obtained and all systems are negative except as noted in the HPI and PMH.   Patient's Health History    Past Medical History:  Diagnosis Date   Arrest of dilation, delivered, current hospitalization 03/17/2019   Status post cesarean delivery 03/17/2019    Past Surgical History:  Procedure Laterality Date   CESAREAN SECTION N/A 03/17/2019   Procedure: CESAREAN SECTION;  Surgeon: Edwinna Areola, DO;  Location: [facility] LD ORS;  Service: Obstetrics;  Laterality: N/A;    Family History  Problem Relation Age of Onset   Healthy Mother    Healthy Father     Social History   Socioeconomic History   Marital status: Single    Spouse name: Not on file   Number of children: Not on file   Years of education: Not on file   Highest education level: Not on file  Occupational History   Not on file   Tobacco Use   Smoking status: Never   Smokeless tobacco: Never  Vaping Use   Vaping Use: Never used  Substance and Sexual Activity   Alcohol use: No   Drug use: No   Sexual activity: Yes    Birth control/protection: None  Other Topics Concern   Not on file  Social History Narrative   Not on file   Social Determinants of Health   Financial Resource Strain: Not on file  Food Insecurity: Not on file  Transportation Needs: Not on file  Physical Activity: Not on file  Stress: Not on file  Social Connections: Not on file  Intimate Partner Violence: Not on file     Physical Exam   Physical Exam Constitutional:      Appearance: She is well-developed. She is not ill-appearing.  HENT:     Head: Normocephalic and atraumatic.     Right Ear: External ear normal.     Left Ear: External ear normal.     Nose: Nose normal.  Eyes:     General: Lids are normal.        Right eye: No foreign body.        Left eye: No foreign body.     Intraocular pressure: Left eye pressure is 18 mmHg. Measurements were taken using a handheld tonometer.    Extraocular Movements: Extraocular movements  intact.     Right eye: Normal extraocular motion and no nystagmus.     Left eye: Normal extraocular motion and no nystagmus.     Conjunctiva/sclera:     Left eye: Left conjunctiva is injected.     Comments: No periorbital edema.  Visual acuity 20/15 bilaterally.  No proptosis noted  Cardiovascular:     Rate and Rhythm: Normal rate and regular rhythm.     Pulses: Normal pulses.     Heart sounds: Normal heart sounds.  Pulmonary:     Effort: Pulmonary effort is normal.     Breath sounds: Normal breath sounds.  Abdominal:     General: Abdomen is flat.     Palpations: Abdomen is soft.  Musculoskeletal:     Cervical back: Neck supple.  Skin:    General: Skin is warm and dry.  Neurological:     General: No focal deficit present.     Mental Status: She is alert and oriented to person, place, and  time.      Diagnostic and Interventional Summary    Labs Reviewed - No data to display  No orders to display    Medications  tetracaine (PONTOCAINE) 0.5 % ophthalmic solution 2 drop (2 drops Both Eyes Given by Other 09/11/21 1831)  fluorescein ophthalmic strip 1 strip (1 strip Both Eyes Given 09/11/21 1831)     Procedures  /  Critical Care Procedures  ED Course and Medical Decision Making  Initial Impression and Ddx 21 year old female presented to emergency department for evaluation of left eye injection.  Differential diagnosis includes resolving to the following: Foreign body, corneal abrasion, conjunctivitis from either bacterial or viral or allergic source, preseptal cellulitis, orbital cellulitis.  Likely allergic or viral conjunctivitis given no response to antibiotic therapy.  Perform Woods lamp examination to further evaluate.   Interpretation of Diagnostics I personally reviewed the Cardiac Monitor and my interpretation is as follows: Patient normal sinus rhythm on cardiac monitor.    Eyes were numbed with optic tetracaine and Woods lamp examination was performed fluorescein and did not reveal acute corneal ulceration or abrasion.  Given that the patient has recently been on antibiotic therapy I suspect that the likely cause of her symptoms today is viral conjunctivitis.  No periorbital swelling to suggest periorbital cellulitis.  No pain with extraocular movements or proptosis to suggest orbital cellulitis.  Given that she has failed treatment and has had continued to have symptoms after 6 days.  We will place follow-up to ophthalmology  Patient Reassessment and Ultimate Disposition/Management Discharged with erythromycin ointment and ophthalmology follow-up.  Patient agrees to follow-up with ophthalmology service  Patient management required discussion with the following services or consulting groups:  None  Complexity of Problems Addressed Acute illness or injury  that poses threat of life of bodily function  Additional Data Reviewed and Analyzed Further history obtained from: None  Factors Impacting ED Encounter Risk Prescriptions    Final Clinical Impressions(s) / ED Diagnoses     ICD-10-CM   1. Acute conjunctivitis of both eyes, unspecified acute conjunctivitis type  H10.33       ED Discharge Orders          Ordered    erythromycin ophthalmic ointment        09/11/21 1945    Ambulatory referral to Ophthalmology        09/11/21 1945             Discharge Instructions Discussed with and Provided to Patient:  Discharge Instructions      Seen and evaluated in our emergency department.  I suspect that you have allergic conjunctivitis.  I will place follow-up order for outpatient ophthalmology follow-up.  Please call the number attached.  I will also discharge you with a prescription for some.        Camila Li, MD 09/11/21 2218    Derwood Kaplan, MD 09/12/21 1531

## 2021-09-13 ENCOUNTER — Telehealth: Payer: Self-pay

## 2021-09-13 DIAGNOSIS — Z9189 Other specified personal risk factors, not elsewhere classified: Secondary | ICD-10-CM

## 2021-09-13 NOTE — Telephone Encounter (Signed)
Transition Care Management Follow-up Telephone Call Date of discharge and from where: 09/11/2021-[facility]  How have you been since you were released from the hospital? Pt stated she is doing fine.  Any questions or concerns? No  Items Reviewed: Did the pt receive and understand the discharge instructions provided? Yes  Medications obtained and verified? Yes  Other? No  Any new allergies since your discharge? No  Dietary orders reviewed? No Do you have support at home? Yes   Home Care and Equipment/Supplies: Were home health services ordered? not applicable If so, what is the name of the agency? N/A  Has the agency set up a time to come to the patient's home? not applicable Were any new equipment or medical supplies ordered?  No What is the name of the medical supply agency? N/A Were you able to get the supplies/equipment? not applicable Do you have any questions related to the use of the equipment or supplies? No  Functional Questionnaire: (I = Independent and D = Dependent) ADLs: I  Bathing/Dressing- I  Meal Prep- I  Eating- I  Maintaining continence- I  Transferring/Ambulation- I  Managing Meds- I  Follow up appointments reviewed:  PCP Hospital f/u appt confirmed? No   Specialist Hospital f/u appt confirmed? No   Are transportation arrangements needed? No  If their condition worsens, is the pt aware to call PCP or go to the Emergency Dept.? Yes Was the patient provided with contact information for the PCP's office or ED? Yes Was to pt encouraged to call back with questions or concerns? Yes

## 2021-09-14 ENCOUNTER — Telehealth: Payer: Self-pay

## 2021-09-14 NOTE — Telephone Encounter (Signed)
° °  Telephone encounter was:  Unsuccessful.  09/14/2021 Name: Kathryn Clark MRN: [id number] DOB: 2001-01-16  Unsuccessful outbound call made today to assist with:   pcp  Outreach Attempt:  1st Attempt no answer no voicemail set up could not leave a message   [facility], Care Management  [phone] [address], [place], [place] [address] Phone: [phone] Email: [email or web address]

## 2021-09-16 ENCOUNTER — Telehealth: Payer: Self-pay

## 2021-09-16 ENCOUNTER — Other Ambulatory Visit: Payer: Self-pay

## 2021-09-16 ENCOUNTER — Ambulatory Visit
Admission: EM | Admit: 2021-09-16 | Discharge: 2021-09-16 | Disposition: A | Discharge status: Home / Self Care | Payer: 59 | Attending: Internal Medicine | Admitting: Internal Medicine

## 2021-09-16 DIAGNOSIS — S60051A Contusion of right little finger without damage to nail, initial encounter: Secondary | ICD-10-CM | POA: Diagnosis not present

## 2021-09-16 NOTE — Telephone Encounter (Signed)
° °  Telephone encounter was:  Unsuccessful.  09/16/2021 Name: Kathryn Clark MRN: [id number] DOB: January 28, 2001  Unsuccessful outbound call made today to assist with:   pcp  Outreach Attempt:  2nd Attempt  could not leave a message no voicemail avalible   Lenard Forth Care Guide, Embedded Care Coordination [facility], Care Management  [phone] [address] [place], [place], [place] [address] Phone: [phone] Email: [email or web address]

## 2021-09-16 NOTE — Discharge Instructions (Addendum)
Please go to the med center [facility] radiology department for x-ray of your right little finger. Please tell the front desk that you want to go to radiology to get an X-Ray done Please ask to go to Radiology when you get there.   Please take ibuprofen or Tylenol as needed for for pain Icing of the right little finger Return to urgent care if symptoms worsen Please return to urgent care if symptoms worsen.

## 2021-09-16 NOTE — ED Triage Notes (Addendum)
Pt states last night she had a fall. She reports hitting her head and has pain to her right pinky finger. Pt is A&Ox4. Patient denies changes to vision at this time. Patient has an abrasion to the top of her head (near forehead).

## 2021-09-17 ENCOUNTER — Inpatient Hospital Stay
Admission: RE | Admit: 2021-09-17 | Discharge: 2021-09-17 | Disposition: A | Discharge status: Home / Self Care | Payer: 59 | Source: Ambulatory Visit

## 2021-09-17 ENCOUNTER — Telehealth: Payer: Self-pay

## 2021-09-17 NOTE — ED Provider Notes (Signed)
[facility]    CSN: [id number] Arrival date & time: 09/16/21  1429      History   Chief Complaint Chief Complaint  Patient presents with   Fall    HPI Kathryn Clark is a 21 y.o. female comes to the urgent care with a 1 day history of scalp pain and right little finger pain.  Patient fell last night and hit her forehead on the concrete floor.  She was drinking, "drunk and lost her step leading to her fall.  She denies loss of consciousness.  No nausea or vomiting.  No double or blurred vision.  Patient also complains of pain in the middle and distal phalanx of the right little finger.  Pain is of moderate severity, sharp, aggravated by trying to make a fist.  She has not tried any over-the-counter medication.  She has some numbness on the fingertip.  No bruising or swelling noted.  HPI  Past Medical History:  Diagnosis Date   Arrest of dilation, delivered, current hospitalization 03/17/2019   Status post cesarean delivery 03/17/2019    Patient Active Problem List   Diagnosis Date Noted   Arrest of dilation, delivered, current hospitalization 03/17/2019   Status post cesarean delivery 03/17/2019   Postpartum care following cesarean delivery 03/17/2019   Pre-eclampsia during pregnancy in third trimester, antepartum 03/15/2019   Morning sickness 09/23/2018    Past Surgical History:  Procedure Laterality Date   CESAREAN SECTION N/A 03/17/2019   Procedure: CESAREAN SECTION;  Surgeon: Edwinna Areola, DO;  Location: [facility];  Service: Obstetrics;  Laterality: N/A;    OB History     Gravida  1   Para  1   Term  1   Preterm      AB      Living  1      SAB      IAB      Ectopic      Multiple  0   Live Births  1            Home Medications    Prior to Admission medications   Medication Sig Start Date End Date Taking? Authorizing Provider  labetalol (NORMODYNE) 200 MG tablet Take 1 tablet (200 mg total) by mouth 2 (two) times daily.  03/20/19 08/06/19  Judeth Horn, NP  promethazine (PHENERGAN) 12.5 MG tablet Take 1 tablet (12.5 mg total) by mouth every 6 (six) hours as needed for nausea or vomiting. 09/23/18 08/06/19  Raelyn Mora, CNM  ranitidine (ZANTAC) 150 MG tablet Take 1 tablet (150 mg total) by mouth 2 (two) times daily. 09/23/18 08/06/19  Raelyn Mora, CNM    Family History Family History  Problem Relation Age of Onset   Healthy Mother    Healthy Father     Social History Social History   Tobacco Use   Smoking status: Never   Smokeless tobacco: Never  Vaping Use   Vaping Use: Never used  Substance Use Topics   Alcohol use: No   Drug use: No     Allergies   Pataday [olopatadine hcl]   Review of Systems Review of Systems  HENT: Negative.    Respiratory: Negative.    Cardiovascular: Negative.   Gastrointestinal:  Negative for abdominal pain, diarrhea, nausea and vomiting.  Neurological:  Negative for weakness and headaches.  Psychiatric/Behavioral:  Negative for agitation and confusion.     Physical Exam Triage Vital Signs ED Triage Vitals  Enc Vitals Group  BP 09/16/21 1518 138/83     Pulse Rate 09/16/21 1518 70     Resp 09/16/21 1518 18     Temp 09/16/21 1518 97.7 F (36.5 C)     Temp Source 09/16/21 1518 Oral     SpO2 09/16/21 1518 98 %     Weight --      Height --      Head Circumference --      Peak Flow --      Pain Score 09/16/21 1515 8     Pain Loc --      Pain Edu? --      Excl. in GC? --    No data found.  Updated Vital Signs BP 138/83 (BP Location: Right Arm)    Pulse 70    Temp 97.7 F (36.5 C) (Oral)    Resp 18    LMP 09/13/2021    SpO2 98%   Visual Acuity Right Eye Distance:   Left Eye Distance:   Bilateral Distance:    Right Eye Near:   Left Eye Near:    Bilateral Near:     Physical Exam Vitals and nursing note reviewed.  Constitutional:      General: She is not in acute distress.    Appearance: She is not ill-appearing.  Cardiovascular:      Rate and Rhythm: Normal rate and regular rhythm.     Pulses: Normal pulses.     Heart sounds: Normal heart sounds.  Pulmonary:     Effort: Pulmonary effort is normal.     Breath sounds: Normal breath sounds.  Abdominal:     General: Bowel sounds are normal.     Palpations: Abdomen is soft.  Musculoskeletal:     Comments: Tenderness on palpation of the distal phalanx and middle phalanx of the right little finger.  Mild bruising noted.  No deformity or swelling noted.  Patient has full range of motion with pain.  Superficial abrasion over the frontal scalp.  No contusion or hematoma noted.  Neurology evaluation is negative  Neurological:     General: No focal deficit present.     Mental Status: She is alert and oriented to person, place, and time.     Cranial Nerves: No cranial nerve deficit.     Sensory: No sensory deficit.     Motor: No weakness.     Coordination: Coordination normal.  Psychiatric:        Mood and Affect: Mood normal.        Behavior: Behavior normal.     UC Treatments / Results  Labs (all labs ordered are listed, but only abnormal results are displayed) Labs Reviewed - No data to display  EKG   Radiology No results found.  Procedures Procedures (including critical care time)  Medications Ordered in UC Medications - No data to display  Initial Impression / Assessment and Plan / UC Course  I have reviewed the triage vital signs and the nursing notes.  Pertinent labs & imaging results that were available during my care of the patient were reviewed by me and considered in my medical decision making (see chart for details).     1.  Scalp abrasions: Okay to wash with soap and water Please apply antibiotic ointment Avoid occlusive dressing No signs of concussion.  Concussion return to ED precautions given. If you notice any redness, swelling, purulent discharge please return to urgent care to be reevaluated  2.  Right little finger  contusion: Outpatient x-ray  of the right little finger Ibuprofen or Tylenol as needed for pain Icing of the right little finger Gentle range of motion exercises Return precautions given.  Final Clinical Impressions(s) / UC Diagnoses   Final diagnoses:  Contusion of right little finger without damage to nail, initial encounter     Discharge Instructions      Please go to the med center [facility] radiology department for x-ray of your right little finger. Please tell the front desk that you want to go to radiology to get an X-Ray done Please ask to go to Radiology when you get there.   Please take ibuprofen or Tylenol as needed for for pain Icing of the right little finger Return to urgent care if symptoms worsen Please return to urgent care if symptoms worsen.    ED Prescriptions   None    PDMP not reviewed this encounter.   Merrilee Jansky, MD 09/17/21 1212

## 2021-09-17 NOTE — Telephone Encounter (Signed)
° °  Telephone encounter was:  Unsuccessful.  09/17/2021 Name: Ciaira Natividad MRN: [id number] DOB: 04-24-2001  Unsuccessful outbound call made today to assist with:   pcp  Outreach Attempt:  3rd Attempt.  Referral closed unable to contact patient. could not leave a message due to no voicemail set up   [facility] Guide, Embedded Care Coordination [facility], Care Management  [phone] [address] [place], [place], [place] [address] Phone: [phone] Email: [email or web address]

## 2021-12-11 ENCOUNTER — Ambulatory Visit: Payer: Self-pay

## 2021-12-12 ENCOUNTER — Ambulatory Visit
Admission: RE | Admit: 2021-12-12 | Discharge: 2021-12-12 | Disposition: A | Discharge status: Home / Self Care | Payer: 59 | Source: Ambulatory Visit | Attending: Urgent Care | Admitting: Urgent Care

## 2021-12-12 ENCOUNTER — Ambulatory Visit: Payer: Self-pay

## 2021-12-12 VITALS — BP 111/73 | HR 72 | Temp 98.2°F | Resp 20

## 2021-12-12 DIAGNOSIS — N760 Acute vaginitis: Secondary | ICD-10-CM

## 2021-12-12 DIAGNOSIS — N898 Other specified noninflammatory disorders of vagina: Secondary | ICD-10-CM

## 2021-12-12 DIAGNOSIS — B9689 Other specified bacterial agents as the cause of diseases classified elsewhere: Secondary | ICD-10-CM | POA: Insufficient documentation

## 2021-12-12 LAB — POCT URINE PREGNANCY: Preg Test, Ur: NEGATIVE

## 2021-12-12 MED ORDER — METRONIDAZOLE 500 MG PO TABS: 500.0000 mg | ORAL_TABLET | Freq: Two times a day (BID) | ORAL | 0 refills | Status: DC

## 2021-12-12 NOTE — ED Provider Notes (Signed)
?[facility] ? ? ?MRN: [id number] DOB: 2001/06/06 ? ?Subjective:  ? ?Kathryn Clark is a 21 y.o. female presenting for several day history of acute onset recurrent malodor of the vaginal area with discharge. Patient is sexually active, no condom use, has 1 female partner. Has pmh of bacterial vaginosis, last episode was Jan 2023. Denies fever, n/v, abdominal pain, pelvic pain, rashes, dysuria, urinary frequency, hematuria.  ? ? ?No current facility-administered medications for this encounter. ?No current outpatient medications on file.  ? ?Allergies  ?Allergen Reactions  ? Pataday [Olopatadine Hcl] Other (See Comments)  ?  Redness and swelling to eye ?  ? ? ?Past Medical History:  ?Diagnosis Date  ? Arrest of dilation, delivered, current hospitalization 03/17/2019  ? Status post cesarean delivery 03/17/2019  ?  ? ?Past Surgical History:  ?Procedure Laterality Date  ? CESAREAN SECTION N/A 03/17/2019  ? Procedure: CESAREAN SECTION;  Surgeon: Edwinna Areola, DO;  Location: [facility];  Service: Obstetrics;  Laterality: N/A;  ? ? ?Family History  ?Problem Relation Age of Onset  ? Healthy Mother   ? Healthy Father   ? ? ?Social History  ? ?Tobacco Use  ? Smoking status: Never  ? Smokeless tobacco: Never  ?Vaping Use  ? Vaping Use: Never used  ?Substance Use Topics  ? Alcohol use: No  ? Drug use: No  ? ? ?ROS ? ? ?Objective:  ? ?Vitals: ?BP 111/73 (BP Location: Right Arm)   Pulse 72   Temp 98.2 ?F (36.8 ?C) (Oral)   Resp 20   LMP 12/02/2021   SpO2 98%  ? ?Physical Exam ?Constitutional:   ?   General: She is not in acute distress. ?   Appearance: Normal appearance. She is well-developed. She is not ill-appearing, toxic-appearing or diaphoretic.  ?HENT:  ?   Head: Normocephalic and atraumatic.  ?   Nose: Nose normal.  ?   Mouth/Throat:  ?   Mouth: Mucous membranes are moist.  ?   Pharynx: Oropharynx is clear.  ?Eyes:  ?   General: No scleral icterus.    ?   Right eye: No discharge.     ?   Left  eye: No discharge.  ?   Extraocular Movements: Extraocular movements intact.  ?   Conjunctiva/sclera: Conjunctivae normal.  ?Cardiovascular:  ?   Rate and Rhythm: Normal rate.  ?Pulmonary:  ?   Effort: Pulmonary effort is normal.  ?Abdominal:  ?   General: Bowel sounds are normal. There is no distension.  ?   Palpations: Abdomen is soft. There is no mass.  ?   Tenderness: There is no abdominal tenderness. There is no right CVA tenderness, left CVA tenderness, guarding or rebound.  ?Skin: ?   General: Skin is warm and dry.  ?Neurological:  ?   General: No focal deficit present.  ?   Mental Status: She is alert and oriented to person, place, and time.  ?Psychiatric:     ?   Mood and Affect: Mood normal.     ?   Behavior: Behavior normal.     ?   Thought Content: Thought content normal.     ?   Judgment: Judgment normal.  ? ?Results for orders placed or performed during the hospital encounter of 12/12/21 (from the past 24 hour(s))  ?POCT urine pregnancy     Status: None  ? Collection Time: 12/12/21  1:08 PM  ?Result Value Ref Range  ? Preg Test, Ur  Negative Negative  ? ? ?Assessment and Plan :  ? ?PDMP not reviewed this encounter. ? ?1. Bacterial vaginosis   ?2. Vaginal discharge   ? ? ?Patient would like to get covered empirically for recurrent bacterial vaginosis and I am in agreement.  Last dosing was for tinidazole and therefore we talked about using metronidazole at this time around.  Patient was agreeable to this.  Labs pending, will treat as appropriate otherwise. Counseled patient on potential for adverse effects with medications prescribed/recommended today, ER and return-to-clinic precautions discussed, patient verbalized understanding. ? ?  ?Wallis Bamberg, PA-C ?12/12/21 1317 ? ?

## 2021-12-12 NOTE — ED Triage Notes (Addendum)
Pt states she feels like her vaginal pH is off, and she denies having any symptoms. Patient is requesting to be tested for pregnancy also. ?

## 2021-12-14 LAB — CERVICOVAGINAL ANCILLARY ONLY
Bacterial Vaginitis (gardnerella): POSITIVE — AB
Candida Glabrata: NEGATIVE
Candida Vaginitis: NEGATIVE
Chlamydia: NEGATIVE
Comment: NEGATIVE
Comment: NEGATIVE
Comment: NEGATIVE
Comment: NEGATIVE
Comment: NEGATIVE
Comment: NORMAL
Neisseria Gonorrhea: NEGATIVE
Trichomonas: NEGATIVE

## 2022-09-01 ENCOUNTER — Ambulatory Visit: Payer: Self-pay

## 2022-09-11 ENCOUNTER — Ambulatory Visit: Payer: Self-pay

## 2022-09-21 ENCOUNTER — Ambulatory Visit
Admission: RE | Admit: 2022-09-21 | Discharge: 2022-09-21 | Disposition: A | Discharge status: Home / Self Care | Payer: Medicaid Other | Source: Ambulatory Visit | Attending: Internal Medicine | Admitting: Internal Medicine

## 2022-09-21 VITALS — BP 117/78 | HR 81 | Temp 98.1°F | Resp 18

## 2022-09-21 DIAGNOSIS — Z7251 High risk heterosexual behavior: Secondary | ICD-10-CM | POA: Diagnosis not present

## 2022-09-21 DIAGNOSIS — Z833 Family history of diabetes mellitus: Secondary | ICD-10-CM | POA: Insufficient documentation

## 2022-09-21 DIAGNOSIS — N3000 Acute cystitis without hematuria: Secondary | ICD-10-CM | POA: Diagnosis present

## 2022-09-21 LAB — POCT URINALYSIS DIP (MANUAL ENTRY)
Bilirubin, UA: NEGATIVE
Blood, UA: NEGATIVE
Glucose, UA: NEGATIVE mg/dL
Ketones, POC UA: NEGATIVE mg/dL
Nitrite, UA: NEGATIVE
Protein Ur, POC: NEGATIVE mg/dL
Spec Grav, UA: 1.02 (ref 1.010–1.025)
Urobilinogen, UA: 0.2 E.U./dL
pH, UA: 8.5 — AB (ref 5.0–8.0)

## 2022-09-21 LAB — POCT URINE PREGNANCY: Preg Test, Ur: NEGATIVE

## 2022-09-21 LAB — POCT FASTING CBG KUC MANUAL ENTRY: POCT Glucose (KUC): 96 mg/dL (ref 70–99)

## 2022-09-21 MED ORDER — NITROFURANTOIN MONOHYD MACRO 100 MG PO CAPS: 100.0000 mg | ORAL_CAPSULE | Freq: Two times a day (BID) | ORAL | 0 refills | Status: DC

## 2022-09-21 NOTE — Discharge Instructions (Addendum)
Please start Macrobid to address an urinary tract infection. Make sure you hydrate very well with plain water and a quantity of 80 ounces of water a day.  Please limit drinks that are considered urinary irritants such as soda, sweet tea, coffee, energy drinks, alcohol.  These can worsen your urinary and genital symptoms but also be the source of them.  I will let you know about your urine culture and vaginal swab results through MyChart to see if we need to prescribe or change your antibiotics based off of those results.

## 2022-09-21 NOTE — ED Provider Notes (Signed)
[facility]  Note:  This document was prepared using Systems analyst and may include unintentional dictation errors.  MRN: [id number] DOB: 01-28-2001  Subjective:   Kathryn Clark is a 22 y.o. female presenting for 1 month history of persistent mitten malodorous cloudy urine, intermittent urinary frequency. Hydrates very well with plain water on days she exercises but then other days she does not. Does not drink much else. No other urinary irritants. Has unprotected sex with 42 female partner. Denies fever, n/v, abdominal pain, pelvic pain, rashes, hematuria, vaginal discharge.  She is requesting a point-of-care blood sugar check.  Reports family history of diabetes and would like to have that done.  She is fasting.   No current facility-administered medications for this encounter.  Current Outpatient Medications:    metroNIDAZOLE (FLAGYL) 500 MG tablet, Take 1 tablet (500 mg total) by mouth 2 (two) times daily., Disp: 14 tablet, Rfl: 0   Allergies  Allergen Reactions   Pataday [Olopatadine Hcl] Other (See Comments)    Redness and swelling to eye     Past Medical History:  Diagnosis Date   Arrest of dilation, delivered, current hospitalization 03/17/2019   Status post cesarean delivery 03/17/2019     Past Surgical History:  Procedure Laterality Date   CESAREAN SECTION N/A 03/17/2019   Procedure: CESAREAN SECTION;  Surgeon: Sherlyn Hay, DO;  Location: [facility];  Service: Obstetrics;  Laterality: N/A;    Family History  Problem Relation Age of Onset   Healthy Mother    Healthy Father     Social History   Tobacco Use   Smoking status: Never   Smokeless tobacco: Never  Vaping Use   Vaping Use: Never used  Substance Use Topics   Alcohol use: No   Drug use: No    ROS   Objective:   Vitals: BP 117/78 (BP Location: Right Arm)   Pulse 81   Temp 98.1 F (36.7 C) (Oral)   Resp 18   LMP 08/29/2022   SpO2 98%    Physical Exam Constitutional:      General: She is not in acute distress.    Appearance: Normal appearance. She is well-developed. She is not ill-appearing, toxic-appearing or diaphoretic.  HENT:     Head: Normocephalic and atraumatic.     Nose: Nose normal.     Mouth/Throat:     Mouth: Mucous membranes are moist.  Eyes:     General: No scleral icterus.       Right eye: No discharge.        Left eye: No discharge.     Extraocular Movements: Extraocular movements intact.     Conjunctiva/sclera: Conjunctivae normal.  Cardiovascular:     Rate and Rhythm: Normal rate.  Pulmonary:     Effort: Pulmonary effort is normal.  Abdominal:     General: Bowel sounds are normal. There is no distension.     Palpations: Abdomen is soft. There is no mass.     Tenderness: There is no abdominal tenderness. There is no right CVA tenderness, left CVA tenderness, guarding or rebound.  Skin:    General: Skin is warm and dry.  Neurological:     General: No focal deficit present.     Mental Status: She is alert and oriented to person, place, and time.  Psychiatric:        Mood and Affect: Mood normal.        Behavior: Behavior normal.  Thought Content: Thought content normal.        Judgment: Judgment normal.    Results for orders placed or performed during the hospital encounter of 09/21/22 (from the past 24 hour(s))  POCT urinalysis dipstick     Status: Abnormal   Collection Time: 09/21/22 11:46 AM  Result Value Ref Range   Color, UA yellow yellow   Clarity, UA cloudy (A) clear   Glucose, UA negative negative mg/dL   Bilirubin, UA negative negative   Ketones, POC UA negative negative mg/dL   Spec Grav, UA 1.020 1.010 - 1.025   Blood, UA negative negative   pH, UA 8.5 (A) 5.0 - 8.0   Protein Ur, POC negative negative mg/dL   Urobilinogen, UA 0.2 0.2 or 1.0 E.U./dL   Nitrite, UA Negative Negative   Leukocytes, UA Small (1+) (A) Negative  POCT urine pregnancy     Status: None    Collection Time: 09/21/22 11:46 AM  Result Value Ref Range   Preg Test, Ur Negative Negative   Assessment and Plan :   PDMP not reviewed this encounter.  1. Acute cystitis without hematuria   2. Unprotected sex   3. Family history of diabetes mellitus     Start Macrobid to cover for acute cystitis, urine culture pending.  Recommended aggressive hydration, limiting urinary irritants.  Patient requested a point-of-care blood sugar check.  I advised that this was unnecessary but patient insisted given a family history of diabetes.  Follow-up with PCP for standard screenings.  Counseled patient on potential for adverse effects with medications prescribed/recommended today, ER and return-to-clinic precautions discussed, patient verbalized understanding.    Jaynee Eagles, PA-C 09/21/22 1226

## 2022-09-21 NOTE — ED Triage Notes (Signed)
Pt c/o "strong smelling" urine x 1 month-nausea x 1 week-NAD-steady gait

## 2022-09-22 LAB — CERVICOVAGINAL ANCILLARY ONLY
Chlamydia: NEGATIVE
Comment: NEGATIVE
Comment: NEGATIVE
Comment: NORMAL
Neisseria Gonorrhea: NEGATIVE
Trichomonas: NEGATIVE

## 2022-09-23 LAB — URINE CULTURE: Culture: >=100000 — AB

## 2022-11-12 ENCOUNTER — Emergency Department (HOSPITAL_COMMUNITY)
Admission: EM | Admit: 2022-11-12 | Discharge: 2022-11-12 | Discharge status: Against Medical Advice | Payer: Medicaid Other | Attending: Emergency Medicine | Admitting: Emergency Medicine

## 2022-11-12 ENCOUNTER — Encounter (HOSPITAL_COMMUNITY): Payer: Self-pay | Admitting: Emergency Medicine

## 2022-11-12 ENCOUNTER — Emergency Department (HOSPITAL_COMMUNITY): Payer: Medicaid Other

## 2022-11-12 DIAGNOSIS — Z5321 Procedure and treatment not carried out due to patient leaving prior to being seen by health care provider: Secondary | ICD-10-CM | POA: Diagnosis not present

## 2022-11-12 DIAGNOSIS — M25562 Pain in left knee: Secondary | ICD-10-CM | POA: Diagnosis not present

## 2022-11-12 NOTE — ED Triage Notes (Signed)
2 mos left knee pain. Been wearing brace but not helping. Pain is sharp radiating from buttocks to foot. Denies fecal or urinary incontinence. Been doing PT at her gym. Has not seen ortho for this.

## 2022-11-13 NOTE — ED Provider Notes (Signed)
Patient left without being seen.  I did not establish care with patient   Kathryn Pander, MD 11/13/22 [phone]

## 2023-09-05 ENCOUNTER — Encounter (HOSPITAL_COMMUNITY): Payer: Self-pay | Admitting: *Deleted

## 2023-09-05 ENCOUNTER — Other Ambulatory Visit: Payer: Self-pay

## 2023-09-05 ENCOUNTER — Inpatient Hospital Stay (HOSPITAL_COMMUNITY)
Admission: AD | Admit: 2023-09-05 | Discharge: 2023-09-05 | Disposition: A | Discharge status: Home / Self Care | Payer: BC Managed Care – PPO | Attending: Student | Admitting: Student

## 2023-09-05 ENCOUNTER — Inpatient Hospital Stay (HOSPITAL_COMMUNITY): Payer: BC Managed Care – PPO

## 2023-09-05 DIAGNOSIS — O3481 Maternal care for other abnormalities of pelvic organs, first trimester: Secondary | ICD-10-CM | POA: Insufficient documentation

## 2023-09-05 DIAGNOSIS — O208 Other hemorrhage in early pregnancy: Secondary | ICD-10-CM | POA: Diagnosis present

## 2023-09-05 DIAGNOSIS — Z3A01 Less than 8 weeks gestation of pregnancy: Secondary | ICD-10-CM | POA: Diagnosis not present

## 2023-09-05 DIAGNOSIS — N8312 Corpus luteum cyst of left ovary: Secondary | ICD-10-CM | POA: Insufficient documentation

## 2023-09-05 LAB — WET PREP, GENITAL
Clue Cells Wet Prep HPF POC: NONE SEEN
Sperm: NONE SEEN
Trich, Wet Prep: NONE SEEN
WBC, Wet Prep HPF POC: >=10 — AB (ref <10)
Yeast Wet Prep HPF POC: NONE SEEN

## 2023-09-05 LAB — URINALYSIS, ROUTINE W REFLEX MICROSCOPIC
Bilirubin Urine: NEGATIVE
Glucose, UA: NEGATIVE mg/dL
Hgb urine dipstick: NEGATIVE
Ketones, ur: NEGATIVE mg/dL
Leukocytes,Ua: NEGATIVE
Nitrite: NEGATIVE
Protein, ur: NEGATIVE mg/dL
Specific Gravity, Urine: 1.025 (ref 1.005–1.030)
pH: 5 (ref 5.0–8.0)

## 2023-09-05 LAB — CBC
HCT: 39.1 % (ref 36.0–46.0)
Hemoglobin: 13.3 g/dL (ref 12.0–15.0)
MCH: 30.6 pg (ref 26.0–34.0)
MCHC: 34 g/dL (ref 30.0–36.0)
MCV: 89.9 fL (ref 80.0–100.0)
Platelets: 223 10*3/uL (ref 150–400)
RBC: 4.35 MIL/uL (ref 3.87–5.11)
RDW: 12.4 % (ref 11.5–15.5)
WBC: 8 10*3/uL (ref 4.0–10.5)
nRBC: 0 % (ref 0.0–0.2)

## 2023-09-05 LAB — POCT PREGNANCY, URINE: Preg Test, Ur: POSITIVE — AB

## 2023-09-05 NOTE — Discharge Instructions (Signed)
 Safe Medications in Pregnancy   Acne: Benzoyl Peroxide Salicylic Acid  Backache/Headache: Tylenol: 2 regular strength every 4 hours OR              2 Extra strength every 6 hours  Colds/Coughs/Allergies: Benadryl (alcohol free) 25 mg every 6 hours as needed Breath right strips Claritin Cepacol throat lozenges Chloraseptic throat spray Cold-Eeze- up to three times per day Cough drops, alcohol free Flonase (by prescription only) Guaifenesin Mucinex Robitussin DM (plain only, alcohol free) Saline nasal spray/drops Sudafed (pseudoephedrine) & Actifed ** use only after [redacted] weeks gestation and if you do not have high blood pressure Tylenol Vicks Vaporub Zinc lozenges Zyrtec   Constipation: Colace Ducolax suppositories Fleet enema Glycerin suppositories Metamucil Milk of magnesia Miralax Senokot Smooth move tea  Diarrhea: Kaopectate Imodium A-D  *NO pepto Bismol  Hemorrhoids: Anusol Anusol HC Preparation H Tucks  Indigestion: Tums Maalox Mylanta Cimetidine (Tagamet HB)** preferred in pregnancy Famotidine (Pepcid) Ranitidine (Zantac)  Insomnia: Benadryl (alcohol free) 25mg  every 6 hours as needed Tylenol PM Unisom, no Gelcaps  Leg Cramps: Tums MagGel  Nausea/Vomiting:  Bonine Dramamine Emetrol Ginger extract Sea bands Meclizine   Nausea medication to take during pregnancy:  Unisom (doxylamine succinate 25 mg tablets) Take one tablet daily at bedtime. If symptoms are not adequately controlled, the dose can be increased to a maximum recommended dose of two tablets daily (1/2 tablet in the morning, 1/2 tablet mid-afternoon and one at bedtime). Vitamin B6 100mg  tablets. Take one tablet twice a day (up to 200 mg per day).  Skin Rashes: Aveeno products Benadryl cream or 25mg  every 6 hours as needed Calamine Lotion 1% cortisone cream  Yeast infection: Gyne-lotrimin 7 Monistat 7   **If taking multiple medications, please check labels to avoid  duplicating the same active ingredients **take medication as directed on the label ** Do not exceed 4000 mg of tylenol in 24 hours **Do not take medications that contain aspirin or ibuprofen

## 2023-09-05 NOTE — MAU Note (Signed)
 Kathryn Clark is a 23 y.o. at Unknown here in MAU reporting: reports has been having light VB for the past 3 days. States noticed some tissue in the toilet on Friday, none since Reports minimal pain, had a ruptured cyst last Monday (seen on US  by OB last Monday).  States VB today is pink/brown and noted with wiping.  LMP: 07/27/2023 Onset of complaint: 3 days Pain score: 3 Vitals:   09/05/23 1430  BP: 116/64  Pulse: 98  Resp: 19  Temp: 98.1 F (36.7 C)  SpO2: 100%     FHT: NA  Lab orders placed from triage: UPT & UA

## 2023-09-05 NOTE — MAU Provider Note (Signed)
 Chief Complaint: Vaginal Bleeding    SUBJECTIVE HPI: Kathryn Clark is a 23 y.o. G2P1001 at [redacted]w[redacted]d by early US  who presents to maternity admissions reporting vaginal bleeding for 3 days.   Last Monday (08/28/23) she started having lower abdominal pain. She found out she had a cyst that ruptured. Abdominal pain is cramping and comes and goes.  She has had bleeding since Friday (09/01/23). She reports passing about 3 blood clots on Saturday (09/02/23). She reports bleeding is not as much as a period, but more than spotting.   She denies vaginal itching/burning, urinary symptoms, h/a, dizziness, n/v, or fever/chills.     Past Medical History:  Diagnosis Date   Arrest of dilation, delivered, current hospitalization 03/17/2019   Status post cesarean delivery 03/17/2019   Past Surgical History:  Procedure Laterality Date   CESAREAN SECTION N/A 03/17/2019   Procedure: CESAREAN SECTION;  Surgeon: Delana Ted Morrison, DO;  Location: [facility];  Service: Obstetrics;  Laterality: N/A;   Social History   Socioeconomic History   Marital status: Single    Spouse name: Not on file   Number of children: Not on file   Years of education: Not on file   Highest education level: Not on file  Occupational History   Not on file  Tobacco Use   Smoking status: Never   Smokeless tobacco: Never  Vaping Use   Vaping status: Never Used  Substance and Sexual Activity   Alcohol use: No   Drug use: No   Sexual activity: Yes    Birth control/protection: None  Other Topics Concern   Not on file  Social History Narrative   Not on file   Social Drivers of Health   Financial Resource Strain: Not on file  Food Insecurity: Not on file  Transportation Needs: Not on file  Physical Activity: Not on file  Stress: Not on file  Social Connections: Unknown (11/15/2022)   Received from [facility]   Social Network    Social Network: Not on file  Intimate Partner Violence: Unknown (11/15/2022)    Received from [facility]   HITS    Physically Hurt: Not on file    Insult or Talk Down To: Not on file    Threaten Physical Harm: Not on file    Scream or Curse: Not on file   No current facility-administered medications on file prior to encounter.   Current Outpatient Medications on File Prior to Encounter  Medication Sig Dispense Refill   metroNIDAZOLE  (FLAGYL ) 500 MG tablet Take 1 tablet (500 mg total) by mouth 2 (two) times daily. 14 tablet 0   nitrofurantoin , macrocrystal-monohydrate, (MACROBID ) 100 MG capsule Take 1 capsule (100 mg total) by mouth 2 (two) times daily. 10 capsule 0   [DISCONTINUED] labetalol  (NORMODYNE ) 200 MG tablet Take 1 tablet (200 mg total) by mouth 2 (two) times daily. 60 tablet 0   [DISCONTINUED] promethazine  (PHENERGAN ) 12.5 MG tablet Take 1 tablet (12.5 mg total) by mouth every 6 (six) hours as needed for nausea or vomiting. 30 tablet 0   [DISCONTINUED] ranitidine  (ZANTAC ) 150 MG tablet Take 1 tablet (150 mg total) by mouth 2 (two) times daily. 60 tablet 0   Allergies  Allergen Reactions   Pataday  [Olopatadine  Hcl] Other (See Comments)    Redness and swelling to eye     I have reviewed patient's Past Medical Hx, Surgical Hx, Family Hx, Social Hx, medications and allergies.   ROS:  Review of Systems Review  of Systems  Other systems negative   Physical Exam  Physical Exam Patient Vitals for the past 24 hrs:  BP Temp Temp src Pulse Resp SpO2 Height Weight  09/05/23 1430 116/64 98.1 F (36.7 C) Oral 98 19 100 % -- --  09/05/23 1422 -- -- -- -- -- -- 5' 4 (1.626 m) 80.7 kg   Constitutional: Well-developed, well-nourished female in no acute distress.  Cardiovascular: normal rate Respiratory: normal effort GI: Abd soft, non-tender. Pos BS x 4 MS: Extremities nontender, no edema, normal ROM Neurologic: Alert and oriented x 4.  GU: Neg CVAT.  LAB RESULTS Results for orders placed or performed during the hospital encounter of  09/05/23 (from the past 24 hours)  Pregnancy, urine POC     Status: Abnormal   Collection Time: 09/05/23  2:10 PM  Result Value Ref Range   Preg Test, Ur POSITIVE (A) NEGATIVE  Urinalysis, Routine w reflex microscopic -Urine, Clean Catch     Status: None   Collection Time: 09/05/23  2:30 PM  Result Value Ref Range   Color, Urine YELLOW YELLOW   APPearance CLEAR CLEAR   Specific Gravity, Urine 1.025 1.005 - 1.030   pH 5.0 5.0 - 8.0   Glucose, UA NEGATIVE NEGATIVE mg/dL   Hgb urine dipstick NEGATIVE NEGATIVE   Bilirubin Urine NEGATIVE NEGATIVE   Ketones, ur NEGATIVE NEGATIVE mg/dL   Protein, ur NEGATIVE NEGATIVE mg/dL   Nitrite NEGATIVE NEGATIVE   Leukocytes,Ua NEGATIVE NEGATIVE  Wet prep, genital     Status: Abnormal   Collection Time: 09/05/23  3:06 PM  Result Value Ref Range   Yeast Wet Prep HPF POC NONE SEEN NONE SEEN   Trich, Wet Prep NONE SEEN NONE SEEN   Clue Cells Wet Prep HPF POC NONE SEEN NONE SEEN   WBC, Wet Prep HPF POC >=10 (A) <10   Sperm NONE SEEN   CBC     Status: None   Collection Time: 09/05/23  3:36 PM  Result Value Ref Range   WBC 8.0 4.0 - 10.5 K/uL   RBC 4.35 3.87 - 5.11 MIL/uL   Hemoglobin 13.3 12.0 - 15.0 g/dL   HCT 60.8 63.9 - 53.9 %   MCV 89.9 80.0 - 100.0 fL   MCH 30.6 26.0 - 34.0 pg   MCHC 34.0 30.0 - 36.0 g/dL   RDW 87.5 88.4 - 84.4 %   Platelets 223 150 - 400 K/uL   nRBC 0.0 0.0 - 0.2 %       IMAGING US  OB LESS THAN 14 WEEKS WITH OB TRANSVAGINAL Result Date: 09/05/2023 CLINICAL DATA:  Initial evaluation for vaginal bleeding, early pregnancy. EXAM: OBSTETRIC <14 WK US  AND TRANSVAGINAL OB US  TECHNIQUE: Both transabdominal and transvaginal ultrasound examinations were performed for complete evaluation of the gestation as well as the maternal uterus, adnexal regions, and pelvic cul-de-sac. Transvaginal technique was performed to assess early pregnancy. COMPARISON:  None Available. FINDINGS: Intrauterine gestational sac: Single Yolk sac:  Present  Embryo:  Present Cardiac Activity: Present Heart Rate: 123 bpm CRL:  4.6 mm   6 w   1 d                  US  EDC: 04/29/2024 Subchorionic hemorrhage: Small subchorionic hemorrhage measuring 6 x 3 x 7 mm without mass effect. Maternal uterus/adnexae: Right ovary not visualized. Small corpus luteal cyst noted within the left ovary. No adnexal mass or free fluid. IMPRESSION: 1. Single viable IUP, estimated gestational age [redacted] weeks and  1 day by crown-rump length, with ultrasound EDC of 04/29/2024. 2. Small subchorionic hemorrhage as above. Electronically Signed   By: Morene Hoard M.D.   On: 09/05/2023 17:24    MAU Management/MDM: I have reviewed the triage vital signs and the nursing notes.   Pertinent labs & imaging results that were available during my care of the patient were reviewed by me and considered in my medical decision making (see chart for details).      I have reviewed her medical records including past results, notes and treatments. Medical, Surgical, and family history were reviewed.  Medications and recent lab tests were reviewed  Ultrasound showing single, viable IUP Subchorionic hemorrhage 6x3x75mm noted   This bleeding/pain can represent a normal pregnancy with bleeding, spontaneous abortion or even an ectopic which can be life-threatening.  The process as listed above helps to determine which of these is present.  ASSESSMENT 1. [redacted] weeks gestation of pregnancy   2. Subchorionic hemorrhage in first trimester     PLAN Discharge home Pt stable at time of discharge. Encouraged to return here if she develops worsening of symptoms, increase in pain, fever, or other concerning symptoms.   Isaiah JONETTA Bridge, MD 09/05/2023  6:05 PM

## 2023-09-06 LAB — RPR: RPR Ser Ql: NONREACTIVE

## 2023-09-06 LAB — GC/CHLAMYDIA PROBE AMP (~~LOC~~) NOT AT ARMC
Chlamydia: NEGATIVE
Comment: NEGATIVE
Comment: NORMAL
Neisseria Gonorrhea: NEGATIVE

## 2023-09-06 LAB — BETA HCG QUANT (REF LAB): hCG Quant: 26268 m[IU]/mL

## 2023-09-28 LAB — OB RESULTS CONSOLE HIV ANTIBODY (ROUTINE TESTING): HIV: NONREACTIVE

## 2023-09-28 LAB — OB RESULTS CONSOLE HEPATITIS B SURFACE ANTIGEN: Hepatitis B Surface Ag: NEGATIVE

## 2023-09-28 LAB — OB RESULTS CONSOLE RUBELLA ANTIBODY, IGM: Rubella: IMMUNE

## 2023-09-28 LAB — OB RESULTS CONSOLE ANTIBODY SCREEN: Antibody Screen: NEGATIVE

## 2023-09-28 LAB — OB RESULTS CONSOLE RPR: RPR: NONREACTIVE

## 2023-09-28 LAB — HEPATITIS C ANTIBODY: HCV Ab: NEGATIVE

## 2023-10-16 ENCOUNTER — Ambulatory Visit
Admission: RE | Admit: 2023-10-16 | Discharge: 2023-10-16 | Disposition: A | Discharge status: Home / Self Care | Source: Ambulatory Visit | Attending: Family Medicine | Admitting: Family Medicine

## 2023-10-16 VITALS — BP 108/70 | HR 84 | Temp 97.8°F | Resp 18

## 2023-10-16 DIAGNOSIS — N3001 Acute cystitis with hematuria: Secondary | ICD-10-CM | POA: Insufficient documentation

## 2023-10-16 LAB — POCT URINALYSIS DIP (MANUAL ENTRY)
Bilirubin, UA: NEGATIVE
Glucose, UA: NEGATIVE mg/dL
Ketones, POC UA: NEGATIVE mg/dL
Nitrite, UA: POSITIVE — AB
Protein Ur, POC: 100 mg/dL — AB
Spec Grav, UA: 1.025 (ref 1.010–1.025)
Urobilinogen, UA: 0.2 U/dL
pH, UA: 5.5 (ref 5.0–8.0)

## 2023-10-16 MED ORDER — CEPHALEXIN 500 MG PO CAPS: 500.0000 mg | ORAL_CAPSULE | Freq: Two times a day (BID) | ORAL | 0 refills | Status: AC

## 2023-10-16 NOTE — ED Triage Notes (Signed)
 Patient presents to UC for urgency, cloudy urine since 4-5 days ago. No OTC meds.

## 2023-10-16 NOTE — Discharge Instructions (Addendum)
 The clinic will contact you with results of the urine culture done today if positive.  Start Keflex twice daily for 7 days.  Lots of rest and fluids.  Follow-up with your PCP or OB if symptoms do not improve.  Please go to the ER if you develop any worsening symptoms such as fever, mid back/flank/kidney pain, vomiting, or any new concerns that arise.  Hope you feel better soon.

## 2023-10-16 NOTE — ED Provider Notes (Signed)
 [facility]    CSN: [id number] Arrival date & time: 10/16/23  0954      History   Chief Complaint Chief Complaint  Patient presents with   Urinary Frequency    UTI and pregnant - Entered by patient    HPI Kathryn Clark is a 23 y.o. female presents for dysuria.  Patient reports 4 to 5 days of urinary urgency and frequency.  Denies burning with urination, hematuria, fevers, nausea/vomiting, flank pain.  No vaginal discharge or STD concern.  Patient is currently [redacted] weeks pregnant.  Denies history of recurrent UTIs.  No OTC medications have been used for symptoms since onset.  No other concerns at this time.   Urinary Frequency    Past Medical History:  Diagnosis Date   Arrest of dilation, delivered, current hospitalization 03/17/2019   Status post cesarean delivery 03/17/2019    Patient Active Problem List   Diagnosis Date Noted   Arrest of dilation, delivered, current hospitalization 03/17/2019   Status post cesarean delivery 03/17/2019   Postpartum care following cesarean delivery 03/17/2019   Pre-eclampsia during pregnancy in third trimester, antepartum 03/15/2019   Morning sickness 09/23/2018    Past Surgical History:  Procedure Laterality Date   CESAREAN SECTION N/A 03/17/2019   Procedure: CESAREAN SECTION;  Surgeon: Edwinna Areola, DO;  Location: [facility] LD [facility]S;  Service: Obstetrics;  Laterality: N/A;    OB History     Gravida  2   Para  1   Term  1   Preterm      AB      Living  1      SAB      IAB      Ectopic      Multiple  0   Live Births  1            Home Medications    Prior to Admission medications   Medication Sig Start Date End Date Taking? Authorizing Provider  cephALEXin (KEFLEX) 500 MG capsule Take 1 capsule (500 mg total) by mouth 2 (two) times daily for 7 days. 10/16/23 10/23/23 Yes Radford Pax, NP  labetalol (NORMODYNE) 200 MG tablet Take 1 tablet (200 mg total) by mouth 2 (two) times daily. 03/20/19 08/06/19   Judeth Horn, NP  promethazine (PHENERGAN) 12.5 MG tablet Take 1 tablet (12.5 mg total) by mouth every 6 (six) hours as needed for nausea or vomiting. 09/23/18 08/06/19  Raelyn Mora, CNM  ranitidine (ZANTAC) 150 MG tablet Take 1 tablet (150 mg total) by mouth 2 (two) times daily. 09/23/18 08/06/19  Raelyn Mora, CNM    Family History Family History  Problem Relation Age of Onset   Healthy Mother    Healthy Father     Social History Social History   Tobacco Use   Smoking status: Never   Smokeless tobacco: Never  Vaping Use   Vaping status: Never Used  Substance Use Topics   Alcohol use: No   Drug use: No     Allergies   Pataday [olopatadine hcl]   Review of Systems Review of Systems  Genitourinary:  Positive for frequency and urgency.     Physical Exam Triage Vital Signs ED Triage Vitals  Encounter Vitals Group     BP 10/16/23 1006 108/70     Systolic BP Percentile --      Diastolic BP Percentile --      Pulse Rate 10/16/23 1006 84     Resp 10/16/23 1006 18  Temp 10/16/23 1006 97.8 F (36.6 C)     Temp Source 10/16/23 1006 Oral     SpO2 10/16/23 1006 99 %     Weight --      Height --      Head Circumference --      Peak Flow --      Pain Score 10/16/23 1005 0     Pain Loc --      Pain Education --      Exclude from Growth Chart --    No data found.  Updated Vital Signs BP 108/70 (BP Location: Right Arm)   Pulse 84   Temp 97.8 F (36.6 C) (Oral)   Resp 18   LMP 07/27/2023   SpO2 99%   Visual Acuity Right Eye Distance:   Left Eye Distance:   Bilateral Distance:    Right Eye Near:   Left Eye Near:    Bilateral Near:     Physical Exam Vitals and nursing note reviewed.  Constitutional:      Appearance: Normal appearance.  HENT:     Head: Normocephalic and atraumatic.  Eyes:     Pupils: Pupils are equal, round, and reactive to light.  Cardiovascular:     Rate and Rhythm: Normal rate.  Pulmonary:     Effort: Pulmonary effort is  normal.  Abdominal:     Tenderness: There is no right CVA tenderness or left CVA tenderness.  Skin:    General: Skin is warm and dry.  Neurological:     General: No focal deficit present.     Mental Status: She is alert and oriented to person, place, and time.  Psychiatric:        Mood and Affect: Mood normal.        Behavior: Behavior normal.      UC Treatments / Results  Labs (all labs ordered are listed, but only abnormal results are displayed) Labs Reviewed  POCT URINALYSIS DIP (MANUAL ENTRY) - Abnormal; Notable for the following components:      Result Value   Clarity, UA cloudy (*)    Blood, UA moderate (*)    Protein Ur, POC =100 (*)    Nitrite, UA Positive (*)    Leukocytes, UA Large (3+) (*)    All other components within normal limits  URINE CULTURE    EKG   Radiology No results found.  Procedures Procedures (including critical care time)  Medications Ordered in UC Medications - No data to display  Initial Impression / Assessment and Plan / UC Course  I have reviewed the triage vital signs and the nursing notes.  Pertinent labs & imaging results that were available during my care of the patient were reviewed by me and considered in my medical decision making (see chart for details).     Reviewed exam and symptoms with patient.  No red flags.  UA positive for UTI, will culture and start Keflex.  Advised fluids and rest.  PCP or OB follow-up if symptoms do not improve.  Strict ER precautions reviewed and patient verbalized understanding. Final Clinical Impressions(s) / UC Diagnoses   Final diagnoses:  Acute cystitis with hematuria     Discharge Instructions      The clinic will contact you with results of the urine culture done today if positive.  Start Keflex twice daily for 7 days.  Lots of rest and fluids.  Follow-up with your PCP or OB if symptoms do not improve.  Please go to  the ER if you develop any worsening symptoms such as fever, mid  back/flank/kidney pain, vomiting, or any new concerns that arise.  Hope you feel better soon.    ED Prescriptions     Medication Sig Dispense Auth. Provider   cephALEXin (KEFLEX) 500 MG capsule Take 1 capsule (500 mg total) by mouth 2 (two) times daily for 7 days. 14 capsule Radford Pax, NP      PDMP not reviewed this encounter.   Radford Pax, NP 10/16/23 1031

## 2023-10-17 ENCOUNTER — Inpatient Hospital Stay (HOSPITAL_COMMUNITY)
Admission: AD | Admit: 2023-10-17 | Discharge: 2023-10-17 | Disposition: A | Discharge status: Home / Self Care | Attending: Obstetrics and Gynecology | Admitting: Obstetrics and Gynecology

## 2023-10-17 DIAGNOSIS — N390 Urinary tract infection, site not specified: Secondary | ICD-10-CM | POA: Insufficient documentation

## 2023-10-17 DIAGNOSIS — O2341 Unspecified infection of urinary tract in pregnancy, first trimester: Secondary | ICD-10-CM

## 2023-10-17 DIAGNOSIS — M549 Dorsalgia, unspecified: Secondary | ICD-10-CM | POA: Diagnosis not present

## 2023-10-17 DIAGNOSIS — Z3A11 11 weeks gestation of pregnancy: Secondary | ICD-10-CM | POA: Diagnosis not present

## 2023-10-17 DIAGNOSIS — O99891 Other specified diseases and conditions complicating pregnancy: Secondary | ICD-10-CM

## 2023-10-17 MED ORDER — OXYCODONE-ACETAMINOPHEN 5-325 MG PO TABS: 1.0000 | ORAL_TABLET | Freq: Four times a day (QID) | ORAL | 0 refills | Status: DC | PRN

## 2023-10-17 MED ORDER — ONDANSETRON 4 MG PO TBDP: 4.0000 mg | ORAL_TABLET | Freq: Three times a day (TID) | ORAL | 0 refills | Status: DC | PRN

## 2023-10-17 MED ORDER — ONDANSETRON 4 MG PO TBDP
4.0000 mg | ORAL_TABLET | Freq: Once | ORAL | Status: AC
  Administered 2023-10-17: 4 mg via ORAL
  Filled 2023-10-17: qty 1

## 2023-10-17 MED ORDER — OXYCODONE-ACETAMINOPHEN 5-325 MG PO TABS
1.0000 | ORAL_TABLET | Freq: Once | ORAL | Status: DC
  Filled 2023-10-17: qty 1

## 2023-10-17 NOTE — MAU Note (Addendum)
 Says back pain started Saturday - Pt says she went to Urgent care this am - for UTI- gave RX- for antibiotic - and collected Urine- says was going to culture urine . Picked up her Rx- took at 11am and 840pm. Says came here tonight for back pain  Took reg Tyl at 1pm- 1 tab.  Back pain -10/10 [facility]- Dr Mindi Slicker- next appointment - 3-27

## 2023-10-17 NOTE — MAU Provider Note (Signed)
 Chief Complaint: No chief complaint on file.   None       SUBJECTIVE HPI: Kathryn Clark is a 23 y.o. G2P1001 at [redacted]w[redacted]d by LMP who presents via EMS to maternity admissions reporting back pain. States was seen and treated for Urinary Tract Infection at Urgent Care today.  Was not having severe back pain then so they did not treat the pain   As the night went on her pain got worse.   Is lower and mid back, a little more on the right. Took one Tylenol earlier today. . She denies vaginal bleeding, n/v, or fever/chills.     Back Pain The current episode started today. The problem occurs constantly. The pain is present in the lumbar spine. The quality of the pain is described as aching and cramping. Pertinent negatives include no abdominal pain, dysuria, fever or numbness. She has tried analgesics for the symptoms. The treatment provided no relief.   RN Note:    Says back pain started Saturday - Pt says she went to Urgent care this am - for UTI- gave RX- for antibiotic - and collected Urine- says was going to culture urine . Picked up her Rx- took at 11am and 840pm. Says came here tonight for back pain  Took reg Tyl at 1pm- 1 tab.  [facility] Dr Mindi [facility]- next appointment - 3-27      Past Medical History:  Diagnosis Date   Arrest of dilation, delivered, current hospitalization 03/17/2019   Status post cesarean delivery 03/17/2019   Past Surgical History:  Procedure Laterality Date   CESAREAN SECTION N/A 03/17/2019   Procedure: CESAREAN SECTION;  Surgeon: Edwinna Areola, DO;  Location: [facility] LD [facility]S;  Service: Obstetrics;  Laterality: N/A;   Social History   Socioeconomic History   Marital status: Single    Spouse name: Not on file   Number of children: Not on file   Years of education: Not on file   Highest education level: Not on file  Occupational History   Not on file  Tobacco Use   Smoking status: Never   Smokeless tobacco: Never  Vaping Use    Vaping status: Never Used  Substance and Sexual Activity   Alcohol use: No   Drug use: No   Sexual activity: Yes    Birth control/protection: None  Other Topics Concern   Not on file  Social History Narrative   Not on file   Social Drivers of Health   Financial Resource Strain: Not on file  Food Insecurity: Not on file  Transportation Needs: Not on file  Physical Activity: Not on file  Stress: Not on file  Social Connections: Unknown (11/15/2022)   Received from [facility]   Social Network    Social Network: Not on file  Intimate Partner Violence: Unknown (11/15/2022)   Received from [facility]   HITS    Physically Hurt: Not on file    Insult or Talk Down To: Not on file    Threaten Physical Harm: Not on file    Scream or Curse: Not on file   No current facility-administered medications on file prior to encounter.   Current Outpatient Medications on File Prior to Encounter  Medication Sig Dispense Refill   cephALEXin (KEFLEX) 500 MG capsule Take 1 capsule (500 mg total) by mouth 2 (two) times daily for 7 days. 14 capsule 0   [DISCONTINUED] labetalol (NORMODYNE) 200 MG tablet Take 1 tablet (200  mg total) by mouth 2 (two) times daily. 60 tablet 0   [DISCONTINUED] promethazine (PHENERGAN) 12.5 MG tablet Take 1 tablet (12.5 mg total) by mouth every 6 (six) hours as needed for nausea or vomiting. 30 tablet 0   [DISCONTINUED] ranitidine (ZANTAC) 150 MG tablet Take 1 tablet (150 mg total) by mouth 2 (two) times daily. 60 tablet 0   Allergies  Allergen Reactions   Pataday [Olopatadine Hcl] Other (See Comments)    Redness and swelling to eye     I have reviewed patient's Past Medical Hx, Surgical Hx, Family Hx, Social Hx, medications and allergies.   ROS:  Review of Systems  Constitutional:  Negative for fever.  Gastrointestinal:  Negative for abdominal pain.  Genitourinary:  Negative for dysuria.  Musculoskeletal:  Positive for back pain.   Neurological:  Negative for numbness.   Review of Systems  Other systems negative   Physical Exam  Physical Exam Patient Vitals for the past 24 hrs:  BP Temp Temp src Pulse Resp Height Weight  10/17/23 0045 120/69 98.9 F (37.2 C) Oral 83 16 5\' 5"  (1.651 m) 84.1 kg   Constitutional: Well-developed, well-nourished female in no acute distress.  Cardiovascular: normal rate Respiratory: normal effort GI: Abd soft, non-tender.  MS: Extremities nontender, no edema, normal ROM Neurologic: Alert and oriented x 4.  GU: Neg CVAT.  FHT 175 by doppler  LAB RESULTS Results for orders placed or performed during the hospital encounter of 10/16/23 (from the past 24 hours)  POCT urinalysis dipstick     Status: Abnormal   Collection Time: 10/16/23 10:18 AM  Result Value Ref Range   Color, UA yellow yellow   Clarity, UA cloudy (A) clear   Glucose, UA negative negative mg/dL   Bilirubin, UA negative negative   Ketones, POC UA negative negative mg/dL   Spec Grav, UA 6.644 0.347 - 1.025   Blood, UA moderate (A) negative   pH, UA 5.5 5.0 - 8.0   Protein Ur, POC =100 (A) negative mg/dL   Urobilinogen, UA 0.2 0.2 or 1.0 E.U./dL   Nitrite, UA Positive (A) Negative   Leukocytes, UA Large (3+) (A) Negative       IMAGING No results found.  MAU Management/MDM: I have reviewed the triage vital signs and the nursing notes.   Pertinent labs & imaging results that were available during my care of the patient were reviewed by me and considered in my medical decision making (see chart for details).      I have reviewed her medical records including past results, notes and treatments. Medical, Surgical, and family history were reviewed.  Medications and recent lab tests were reviewed  Reviewed results States has taken two dose of Keflex today Treatments in MAU included Zofran given for nausea then Percocet ordered, but patient left before Percocet could be given. .    ASSESSMENT 1. Urinary  tract infection in mother during first trimester of pregnancy   2. Back pain affecting pregnancy in first trimester   3. [redacted] weeks gestation of pregnancy     PLAN Discharge home Rx Percocet sent to pharmacy for pain Rx Zofran sent to pharmacy for nausea Continue Keflex as ordered   Follow-up Information     [facility] Follow up.   Contact information: [address]  [address] [place] [address] [phone]                Pt stable at time of discharge. Encouraged to return  here if she develops worsening of symptoms, increase in pain, fever, or other concerning symptoms.    Wynelle Bourgeois CNM, MSN Certified Nurse-Midwife 10/17/2023  1:07 AM

## 2023-10-18 LAB — URINE CULTURE: Culture: >=100000 — AB

## 2023-12-04 ENCOUNTER — Inpatient Hospital Stay (HOSPITAL_COMMUNITY)
Admission: AD | Admit: 2023-12-04 | Discharge: 2023-12-04 | Disposition: A | Discharge status: Home / Self Care | Attending: Obstetrics | Admitting: Obstetrics

## 2023-12-04 ENCOUNTER — Other Ambulatory Visit: Payer: Self-pay

## 2023-12-04 ENCOUNTER — Encounter (HOSPITAL_COMMUNITY): Payer: Self-pay | Admitting: Obstetrics

## 2023-12-04 ENCOUNTER — Inpatient Hospital Stay (HOSPITAL_COMMUNITY)

## 2023-12-04 DIAGNOSIS — O4440 Low lying placenta NOS or without hemorrhage, unspecified trimester: Secondary | ICD-10-CM | POA: Insufficient documentation

## 2023-12-04 DIAGNOSIS — O26899 Other specified pregnancy related conditions, unspecified trimester: Secondary | ICD-10-CM

## 2023-12-04 DIAGNOSIS — O34219 Maternal care for unspecified type scar from previous cesarean delivery: Secondary | ICD-10-CM | POA: Insufficient documentation

## 2023-12-04 DIAGNOSIS — R109 Unspecified abdominal pain: Secondary | ICD-10-CM | POA: Insufficient documentation

## 2023-12-04 DIAGNOSIS — O26892 Other specified pregnancy related conditions, second trimester: Secondary | ICD-10-CM

## 2023-12-04 DIAGNOSIS — Z3A19 19 weeks gestation of pregnancy: Secondary | ICD-10-CM

## 2023-12-04 LAB — WET PREP, GENITAL
Clue Cells Wet Prep HPF POC: NONE SEEN
Sperm: NONE SEEN
Trich, Wet Prep: NONE SEEN
WBC, Wet Prep HPF POC: <10 (ref <10)
Yeast Wet Prep HPF POC: NONE SEEN

## 2023-12-04 NOTE — MAU Note (Signed)
 MAU Triage Note  Kathryn Clark is a 23 y.o. at [redacted]w[redacted]d here in MAU reporting: she's had increased pelvic pain since Saturday.  States pain is a constant pressure in both the pelvis and rectum.  States she was diagnosed with a UTI on Thursday and has been taking antibiotics since Thursday.  Denies VB.  LMP: 07/26/2024 Onset of complaint: Saturday Pain score: 10 Vitals:   12/04/23 1213  BP: 122/76  Pulse: 99  Resp: 18  Temp: 98.2 F (36.8 C)  SpO2: 100%     FHT: 152 bpm  Lab orders placed from triage: None

## 2023-12-04 NOTE — Discharge Instructions (Addendum)
 It can be helpful to take Metamucil or Miralax once daily to ensure your stools stay soft and reduce pelvic pressure. With the warmer weather, you may also need to increase fluid intake to around 96 ounces of fluid each day.  Round Ligament Pain During Pregnancy Round ligament pain is a sharp pain or jabbing feeling often felt in the lower belly or groin area on one or both sides. It is one of the most common complaints during pregnancy and is considered a normal part of pregnancy. It is most often felt during the second trimester.  Here is what you need to know about round ligament pain, including some tips to help you feel better.  Causes of Round Ligament Pain  Several thick ligaments surround and support your womb (uterus) as it grows during pregnancy. One of them is called the round ligament.  The round ligament connects the front part of the womb to your groin, the area where your legs attach to your pelvis. The round ligament normally tightens and relaxes slowly.  As your baby and womb grow, the round ligament stretches. That makes it more likely to become strained.  Sudden movements can cause the ligament to tighten quickly, like a rubber band snapping. This causes a sudden and quick jabbing feeling.  Symptoms of Round Ligament Pain  Round ligament pain can be concerning and uncomfortable. But it is considered normal as your body changes during pregnancy.  The symptoms of round ligament pain include a sharp, sudden spasm in the belly. It usually affects the right side, but it may happen on both sides. The pain only lasts a few seconds.  Exercise may cause the pain, as will rapid movements such as:  sneezing coughing laughing rolling over in bed standing up too quickly  Comfort Measures:  Soak in a warm tub Tylenol  1000 mg by mouth every 6-8 hrs as needed for pain Slow position changes Laying on the affected side    Massage: Starting at the middle of your pubic bone,  trace little circles in a wide U from your pubic bone to your hip bones on both sides.  Then starting just above your pubic bone, press in and down, alternating sides to create a gentle rocking of your uterus back and forth.  Move your hands up the sides of your belly and back down. Do this 3-5 times upon waking and before bed.   Stretches: Get on hands and knees and alternate arching your back deeply while inhaling, and then rounding your back while exhaling. Modified runners lunge:  - Sit on a chair with half of your bottom on the chair and half off.  - Sit up tall, plant your front foot, and stretch your other foot out behind you.  - Breathe deeply for 5 breaths and then do the other side.   Also chiropractors and massages are safe in pregnancy. [facility] in [place] specializes in pregnancy care. You can visit their website at: [email or web address] to schedule a new patient chiropractic treatment (1 hour) appointment. Please let us  know if you have any other questions or concerns.  Maternity Support [employer] can purchase on [place] or through [place]. Below is an example.

## 2023-12-04 NOTE — MAU Provider Note (Cosign Needed Addendum)
 Chief Complaint:  Pelvic Pain   HPI   None     Kathryn Clark is a 23 y.o. G3P1011 at [redacted]w[redacted]d who presents to maternity admissions reporting pelvic pain. She reports that she was diagnosed with a UTI on 11/30/2023 and has been taking antibiotics since that time. She began having increased pelvic pain since Saturday, which she describes as a constant pressure in the pelvis and rectum. Pain is rated 10/10 currently. Aggravating factors include standing walking, or moving. Pain is most tolerable when laying down. At home, she tries to avoid pain medication so she has not tried anything. Denies constipation, states she does get some relief with bowel movements. Denies vaginal bleeding. She endorses watery white discharge. Denies nausea, vomiting, diarrhea, constipation, fever, chills.   Pregnancy Course: Receives care at [facility]. Prenatal records requested. History significant for hx cesarean section, gHTN, suction D&C 10/07/2020.  Past Medical History:  Diagnosis Date   Arrest of dilation, delivered, current hospitalization 03/17/2019   Status post cesarean delivery 03/17/2019   OB History  Gravida Para Term Preterm AB Living  3 1 1  1 1   SAB IAB Ectopic Multiple Live Births   0  0 1    # Outcome Date GA Lbr Len/2nd Weight Sex Type Anes PTL Lv  3 Current           2 AB 10/07/20     TAB     1 Term 03/17/19 [redacted]w[redacted]d  2825 g M CS-LTranv EPI  LIV   Past Surgical History:  Procedure Laterality Date   CESAREAN SECTION N/A 03/17/2019   Procedure: CESAREAN SECTION;  Surgeon: Loa Riling, DO;  Location: [facility] LD ORS;  Service: Obstetrics;  Laterality: N/A;   Family History  Problem Relation Age of Onset   Healthy Mother    Healthy Father    Social History   Tobacco Use   Smoking status: Never   Smokeless tobacco: Never  Vaping Use   Vaping status: Never Used  Substance Use Topics   Alcohol use: No   Drug use: No   Allergies  Allergen Reactions   Pataday  [Olopatadine  Hcl]  Other (See Comments)    Redness and swelling to eye    Medications Prior to Admission  Medication Sig Dispense Refill Last Dose/Taking   Prenatal Vit-Fe Fumarate-FA (MULTIVITAMIN-PRENATAL) 27-0.8 MG TABS tablet Take 1 tablet by mouth daily at 12 noon.   Past Week   ondansetron  (ZOFRAN -ODT) 4 MG disintegrating tablet Take 1 tablet (4 mg total) by mouth every 8 (eight) hours as needed for nausea or vomiting. 20 tablet 0    oxyCODONE -acetaminophen  (PERCOCET/ROXICET) 5-325 MG tablet Take 1-2 tablets by mouth every 6 (six) hours as needed for severe pain (pain score 7-10). 10 tablet 0     I have reviewed patient's Past Medical Hx, Surgical Hx, Family Hx, Social Hx, medications and allergies.   ROS  Pertinent items noted in HPI and remainder of comprehensive ROS otherwise negative.   PHYSICAL EXAM  Patient Vitals for the past 24 hrs:  BP Temp Temp src Pulse Resp SpO2 Height Weight  12/04/23 1311 113/71 -- -- 83 -- 100 % -- --  12/04/23 1213 122/76 98.2 F (36.8 C) Oral 99 18 100 % -- --  12/04/23 1208 -- -- -- -- -- -- 5\' 4"  (1.626 m) 89.6 kg    Constitutional: Well-developed, well-nourished female in no acute distress.  HEENT: atraumatic, normocephalic. Neck has normal ROM. EOM intact. Cardiovascular: normal rate & rhythm, warm  and well-perfused Respiratory: normal effort, no problems with respiration noted GI: Abd soft, non-tender, non-distended MSK: Extremities nontender, no edema, normal ROM Skin: warm and dry. Acyanotic, no jaundice or pallor. Neurologic: Alert and oriented x 4. No abnormal coordination. Psychiatric: Normal mood. Speech not slurred, not rapid/pressured. Patient is cooperative. GU: no CVA tenderness Pelvic exam: normal external genitalia, vulva, vagina, cervix, uterus and adnexa, VULVA: normal appearing vulva with no masses, tenderness or lesions, VAGINA: normal appearing vagina with normal color and discharge, no lesions, CERVIX: cervical discharge present - white  and thin, multiparous os, exam chaperoned by Rockne Chyle RN.  Labs: Results for orders placed or performed during the hospital encounter of 12/04/23 (from the past 24 hours)  Wet prep, genital     Status: None   Collection Time: 12/04/23  1:35 PM  Result Value Ref Range   Yeast Wet Prep HPF POC NONE SEEN NONE SEEN   Trich, Wet Prep NONE SEEN NONE SEEN   Clue Cells Wet Prep HPF POC NONE SEEN NONE SEEN   WBC, Wet Prep HPF POC <10 <10   Sperm NONE SEEN     Imaging:  No results found.  MDM & MAU COURSE  MDM: Moderate  MAU Course: -Vital signs within normal limits. -Wet prep to rule out infectious etiology. NEGATIVE. -Patient able to pull up [facility] portal with UA and urine culture results. Confirmed E. Coli bacteria, on appropriate abx with Keflex . -US  to rule out uterine or ovarian abnormalities. On initial read, no signs of placental abruption, cervical abnormalities.  Differential diagnosis considered for lower abdominal pain includes but is not limited to: round ligament pain, UTI, PID, cervicitis, chorioamnionitis, appendicitis, constipation  Orders Placed This Encounter  Procedures   Wet prep, genital   US  MFM OB LIMITED   Discharge patient   No orders of the defined types were placed in this encounter.   ASSESSMENT   1. Pain of round ligament affecting pregnancy, antepartum   2. [redacted] weeks gestation of pregnancy     PLAN  Discharge home in stable condition with preterm labor and abdominal pain precautions.  Pain likely due to round ligament. Provided verbal and written education regarding relief of round ligament pain. Patient verbalized understanding and is agreeable to this plan. Continue full course of antibiotics for UTI.    Follow-up Information     [facility] Follow up.   Why: As scheduled for ongoing prenatal care Contact information: [address] [place] [address] [phone]                   Allergies as of 12/04/2023       Reactions   Pataday  [olopatadine  Hcl] Other (See Comments)   Redness and swelling to eye        Medication List     TAKE these medications    multivitamin-prenatal 27-0.8 MG Tabs tablet Take 1 tablet by mouth daily at 12 noon.   ondansetron  4 MG disintegrating tablet Commonly known as: ZOFRAN -ODT Take 1 tablet (4 mg total) by mouth every 8 (eight) hours as needed for nausea or vomiting.   oxyCODONE -acetaminophen  5-325 MG tablet Commonly known as: PERCOCET/ROXICET Take 1-2 tablets by mouth every 6 (six) hours as needed for severe pain (pain score 7-10).        Noreene Bearded, PA

## 2023-12-13 ENCOUNTER — Other Ambulatory Visit: Payer: Self-pay | Admitting: Obstetrics and Gynecology

## 2023-12-13 DIAGNOSIS — Z369 Encounter for antenatal screening, unspecified: Secondary | ICD-10-CM

## 2024-01-23 ENCOUNTER — Other Ambulatory Visit

## 2024-01-23 DIAGNOSIS — O4440 Low lying placenta NOS or without hemorrhage, unspecified trimester: Secondary | ICD-10-CM

## 2024-02-03 ENCOUNTER — Other Ambulatory Visit: Payer: Self-pay | Admitting: Family Medicine

## 2024-02-03 ENCOUNTER — Other Ambulatory Visit: Payer: Self-pay

## 2024-02-03 ENCOUNTER — Encounter (HOSPITAL_COMMUNITY): Payer: Self-pay | Admitting: Obstetrics

## 2024-02-03 ENCOUNTER — Inpatient Hospital Stay (HOSPITAL_COMMUNITY)
Admission: AD | Admit: 2024-02-03 | Discharge: 2024-02-03 | Disposition: A | Discharge status: Home Health | Attending: Obstetrics | Admitting: Obstetrics

## 2024-02-03 ENCOUNTER — Ambulatory Visit: Payer: Self-pay

## 2024-02-03 ENCOUNTER — Inpatient Hospital Stay (HOSPITAL_BASED_OUTPATIENT_CLINIC_OR_DEPARTMENT_OTHER)

## 2024-02-03 DIAGNOSIS — O26892 Other specified pregnancy related conditions, second trimester: Secondary | ICD-10-CM

## 2024-02-03 DIAGNOSIS — Z3A27 27 weeks gestation of pregnancy: Secondary | ICD-10-CM

## 2024-02-03 DIAGNOSIS — O358XX0 Maternal care for other (suspected) fetal abnormality and damage, not applicable or unspecified: Secondary | ICD-10-CM

## 2024-02-03 DIAGNOSIS — O34219 Maternal care for unspecified type scar from previous cesarean delivery: Secondary | ICD-10-CM | POA: Diagnosis not present

## 2024-02-03 DIAGNOSIS — O2343 Unspecified infection of urinary tract in pregnancy, third trimester: Secondary | ICD-10-CM

## 2024-02-03 DIAGNOSIS — O4442 Low lying placenta NOS or without hemorrhage, second trimester: Secondary | ICD-10-CM

## 2024-02-03 LAB — URINALYSIS, ROUTINE W REFLEX MICROSCOPIC
Bilirubin Urine: NEGATIVE
Glucose, UA: NEGATIVE mg/dL
Ketones, ur: NEGATIVE mg/dL
Nitrite: NEGATIVE
Protein, ur: 100 mg/dL — AB
RBC / HPF: >50 RBC/hpf (ref 0–5)
Specific Gravity, Urine: 1.012 (ref 1.005–1.030)
WBC, UA: >50 WBC/hpf (ref 0–5)
pH: 6 (ref 5.0–8.0)

## 2024-02-03 MED ORDER — PHENAZOPYRIDINE HCL 100 MG PO TABS
200.0000 mg | ORAL_TABLET | Freq: Once | ORAL | Status: AC
  Administered 2024-02-03: 200 mg via ORAL
  Filled 2024-02-03: qty 2

## 2024-02-03 MED ORDER — CEPHALEXIN 500 MG PO CAPS
500.0000 mg | ORAL_CAPSULE | Freq: Four times a day (QID) | ORAL | 0 refills | Status: DC
Start: 2024-02-03 — End: 2024-02-03

## 2024-02-03 MED ORDER — CEFADROXIL 500 MG PO CAPS: 500.0000 mg | ORAL_CAPSULE | Freq: Two times a day (BID) | ORAL | Status: AC

## 2024-02-03 MED ORDER — CEFADROXIL 500 MG PO CAPS: 500.0000 mg | ORAL_CAPSULE | Freq: Every day | ORAL | 3 refills | Status: DC

## 2024-02-03 MED ORDER — PHENAZOPYRIDINE HCL 200 MG PO TABS
200.0000 mg | ORAL_TABLET | Freq: Three times a day (TID) | ORAL | 1 refills | Status: DC | PRN
Start: 2024-02-03 — End: 2024-03-14

## 2024-02-03 MED ORDER — CEPHALEXIN 500 MG PO CAPS: 500.0000 mg | ORAL_CAPSULE | Freq: Four times a day (QID) | ORAL | 0 refills | Status: DC

## 2024-02-03 MED ORDER — OXYCODONE-ACETAMINOPHEN 5-325 MG PO TABS: 1.0000 | ORAL_TABLET | Freq: Four times a day (QID) | ORAL | 0 refills | Status: DC | PRN

## 2024-02-03 MED ORDER — ACETAMINOPHEN 500 MG PO TABS
1000.0000 mg | ORAL_TABLET | Freq: Once | ORAL | Status: AC
  Administered 2024-02-03: 1000 mg via ORAL
  Filled 2024-02-03: qty 2

## 2024-02-03 MED ORDER — CEFADROXIL 500 MG PO CAPS: 500.0000 mg | ORAL_CAPSULE | Freq: Every day | ORAL | 0 refills | Status: DC

## 2024-02-03 MED ORDER — CEPHALEXIN 500 MG PO CAPS: 500.0000 mg | ORAL_CAPSULE | Freq: Every day | ORAL | 2 refills | Status: DC

## 2024-02-03 NOTE — Progress Notes (Signed)
 Spoke with [facility] [employer], did not receive initial prescription for cephalexin . Resent cephalexin  prescription. Olam Boards CNM also spoke with patient directly to clarify routine for antibiotics should be: Cefadroxil  Christie) twice daily for 7 days. THEN take cephalexin  (Keflex ) daily as prophylaxis.

## 2024-02-03 NOTE — MAU Provider Note (Signed)
 Chief Complaint:  Contractions   None     HPI: Kathryn Clark is a 23 y.o. G3P1011 at [redacted]w[redacted]d who presents to maternity admissions reporting onset of lower abdominal pain 2 days ago, then dysuria and flank pain yesterday that has worsened today.  There is associated urinary frequency.  She has hx UTI x 2 in this pregnancy and symptoms were similar.  She denies any n/v or fever/chills.   She reports good fetal movement.  Pt has low lying placenta, with planned US  at [facility] on Monday to see if it has resolved.  She denies vaginal bleeding.   HPI  Past Medical History: Past Medical History:  Diagnosis Date   Arrest of dilation, delivered, current hospitalization 03/17/2019   Status post cesarean delivery 03/17/2019    Past obstetric history: OB History  Gravida Para Term Preterm AB Living  3 1 1  1 1   SAB IAB Ectopic Multiple Live Births   0  0 1    # Outcome Date GA Lbr Len/2nd Weight Sex Type Anes PTL Lv  3 Current           2 AB 10/07/20     TAB     1 Term 03/17/19 [redacted]w[redacted]d  2825 g M CS-LTranv EPI  LIV    Past Surgical History: Past Surgical History:  Procedure Laterality Date   CESAREAN SECTION N/A 03/17/2019   Procedure: CESAREAN SECTION;  Surgeon: Delana Ted Morrison, DO;  Location: [facility] LD [facility]S;  Service: Obstetrics;  Laterality: N/A;    Family History: Family History  Problem Relation Age of Onset   Healthy Mother    Healthy Father     Social History: Social History   Tobacco Use   Smoking status: Never   Smokeless tobacco: Never  Vaping Use   Vaping status: Never Used  Substance Use Topics   Alcohol use: No   Drug use: No    Allergies:  Allergies  Allergen Reactions   Pataday  [Olopatadine  Hcl] Other (See Comments)    Redness and swelling to eye     Meds:  Medications Prior to Admission  Medication Sig Dispense Refill Last Dose/Taking   Prenatal Vit-Fe Fumarate-FA (MULTIVITAMIN-PRENATAL) 27-0.8 MG TABS tablet Take 1 tablet by mouth daily at 12  noon.   Past Week   ondansetron  (ZOFRAN -ODT) 4 MG disintegrating tablet Take 1 tablet (4 mg total) by mouth every 8 (eight) hours as needed for nausea or vomiting. 20 tablet 0 Unknown   [DISCONTINUED] oxyCODONE -acetaminophen  (PERCOCET/ROXICET) 5-325 MG tablet Take 1-2 tablets by mouth every 6 (six) hours as needed for severe pain (pain score 7-10). 10 tablet 0     ROS:  Review of Systems  Constitutional:  Negative for chills, fatigue and fever.  Eyes:  Negative for visual disturbance.  Respiratory:  Negative for shortness of breath.   Cardiovascular:  Negative for chest pain.  Gastrointestinal:  Positive for abdominal pain. Negative for nausea and vomiting.  Genitourinary:  Positive for dysuria, flank pain and frequency. Negative for difficulty urinating, pelvic pain, vaginal bleeding, vaginal discharge and vaginal pain.  Neurological:  Negative for dizziness and headaches.  Psychiatric/Behavioral: Negative.       I have reviewed patient's Past Medical Hx, Surgical Hx, Family Hx, Social Hx, medications and allergies.   Physical Exam  Patient Vitals for the past 24 hrs:  BP Temp Temp src Pulse Resp SpO2 Height Weight  02/03/24 1130 122/68 97.9 F (36.6 C) Oral 88 20 99 % -- --  02/03/24  9149 -- -- -- -- -- 99 % -- --  02/03/24 0847 117/74 -- -- 94 16 -- -- --  02/03/24 0826 120/67 98.2 F (36.8 C) Oral 89 15 100 % 5' 4 (1.626 m) 94.7 kg   Constitutional: Well-developed, well-nourished female in no acute distress.  Cardiovascular: normal rate Respiratory: normal effort GI: Abd soft, non-tender, gravid appropriate for gestational age, no rebound tenderness or guarding MS: Extremities nontender, no edema, normal ROM Neurologic: Alert and oriented x 4.  GU: Neg CVAT bilaterally  PELVIC EXAM:   Dilation: Closed Effacement (%): Thick Exam by:: L. Leftwich-Kirby CNM  FHT:  Baseline 145 , moderate variability, accelerations present, no decelerations Contractions: rare, mild to  palpation   Labs: Results for orders placed or performed during the hospital encounter of 02/03/24 (from the past 24 hours)  Urinalysis, Routine w reflex microscopic -Urine, Clean Catch     Status: Abnormal   Collection Time: 02/03/24  8:57 AM  Result Value Ref Range   Color, Urine YELLOW YELLOW   APPearance CLOUDY (A) CLEAR   Specific Gravity, Urine 1.012 1.005 - 1.030   pH 6.0 5.0 - 8.0   Glucose, UA NEGATIVE NEGATIVE mg/dL   Hgb urine dipstick MODERATE (A) NEGATIVE   Bilirubin Urine NEGATIVE NEGATIVE   Ketones, ur NEGATIVE NEGATIVE mg/dL   Protein, ur 899 (A) NEGATIVE mg/dL   Nitrite NEGATIVE NEGATIVE   Leukocytes,Ua LARGE (A) NEGATIVE   RBC / HPF >50 0 - 5 RBC/hpf   WBC, UA >50 0 - 5 WBC/hpf   Bacteria, UA RARE (A) NONE SEEN   Squamous Epithelial / HPF 0-5 0 - 5 /HPF   Mucus PRESENT    Non Squamous Epithelial 0-5 (A) NONE SEEN      Imaging:   MFM Limited OB US  with preliminary report indicating resolved low lying placenta, > 2.5 cm from cervical os  MAU Course/MDM: Orders Placed This Encounter  Procedures   US  MFM OB Limited   US  MFM OB Limited   Urinalysis, Routine w reflex microscopic -Urine, Clean Catch   Discharge patient Discharge disposition: 06-Home-Health Care Svc; Discharge patient date: 02/03/2024    Meds ordered this encounter  Medications   acetaminophen  (TYLENOL ) tablet 1,000 mg   phenazopyridine  (PYRIDIUM ) tablet 200 mg   oxyCODONE -acetaminophen  (PERCOCET/ROXICET) 5-325 MG tablet    Sig: Take 1-2 tablets by mouth every 6 (six) hours as needed for severe pain (pain score 7-10).    Dispense:  6 tablet    Refill:  0    Has kidney infection, being treated with Keflex .  [redacted] wks pregnant, Oxycodone  safe in limited doses    Supervising Provider:   EVELINE LYNWOOD MATSU [[id number]]   phenazopyridine  (PYRIDIUM ) 200 MG tablet    Sig: Take 1 tablet (200 mg total) by mouth 3 (three) times daily as needed for pain (urethral spasm).    Dispense:  10 tablet    Refill:  1     Supervising Provider:   EVELINE LYNWOOD MATSU [[id number]]   cefadroxil  (DURICEF) 500 MG capsule    Sig: Take 1 capsule (500 mg total) by mouth daily. Start daily medication after completing 7 day course of Duricef.    Dispense:  30 capsule    Refill:  3    Supervising Provider:   EVELINE LYNWOOD MATSU [[id number]]   cephALEXin  (KEFLEX ) 500 MG capsule    Sig: Take 1 capsule (500 mg total) by mouth 4 (four) times daily.    Dispense:  20 capsule  Refill:  0    Supervising Provider:   EVELINE LYNWOOD MATSU [[id number]]     NST reviewed and appropriate for gestational age No acute abdomen, no rebound tenderness, no n/v or fever so unlikely appendicitis or other abdominal process No CVAT or n/v making pyelonephritis unlikely as well Will treat for UTI with Duricef x 7 days now and daily Keflex  after for prevention F/U in OB office as scheduled and return to MAU as needed Reviewed s/sx of worsening infection/reasons to return  Assessment: 1. 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 [redacted] weeks gestation of pregnancy     Plan: Discharge home Labor precautions and fetal kick counts  Follow-up Information     [facility] Follow up.   Why: As scheduled Contact information: [address]  [address] [place] [address] [phone]         [facility] Follow up.   Specialty: Obstetrics and Gynecology Why: As needed for emergencies Contact information: [address] [place]  [address] [phone]               Allergies as of 02/03/2024       Reactions   Pataday  [olopatadine  Hcl] Other (See Comments)   Redness and swelling to eye        Medication List     TAKE these medications    cefadroxil  500 MG capsule Commonly known as: DURICEF Take 1 capsule (500 mg total) by mouth daily. Start daily medication after completing 7 day course of Duricef.   cephALEXin  500 MG capsule Commonly known  as: KEFLEX  Take 1 capsule (500 mg total) by mouth 4 (four) times daily.   multivitamin-prenatal 27-0.8 MG Tabs tablet Take 1 tablet by mouth daily at 12 noon.   ondansetron  4 MG disintegrating tablet Commonly known as: ZOFRAN -ODT Take 1 tablet (4 mg total) by mouth every 8 (eight) hours as needed for nausea or vomiting.   oxyCODONE -acetaminophen  5-325 MG tablet Commonly known as: PERCOCET/ROXICET Take 1-2 tablets by mouth every 6 (six) hours as needed for severe pain (pain score 7-10).   phenazopyridine  200 MG tablet Commonly known as: Pyridium  Take 1 tablet (200 mg total) by mouth 3 (three) times daily as needed for pain (urethral spasm).        Olam Boards Certified Nurse-Midwife 02/03/2024 11:59 AM

## 2024-02-03 NOTE — MAU Note (Signed)
 Kathryn Clark is a 23 y.o. at [redacted]w[redacted]d here in MAU reporting: started 2 nights ago and her stomach felt tight and then would go away. Then last night her lower abdomen felt crampy and feels tight this morning. Also experiencing UTI symptoms- increase in urge to urinate, increased frequency, sharp pelvic pain that is constant on right side, right lower back pain. Denies any burning with urination and denies VB. Denies any recent sexual intercourse. Denies any vaginal discharge, itching or odor. Reports feeling fetal movement. Reports having low lying placenta and has MFM appointment on Monday 02/05/24.    Onset of complaint: 2 days ago Pain score: abdominal 8-9  back 6   pelvis 9 Vitals:   02/03/24 0826  BP: 120/67  Pulse: 89  Resp: 15  Temp: 98.2 F (36.8 C)  SpO2: 100%     FHT:145 Lab orders placed from triage:  UA

## 2024-02-22 ENCOUNTER — Ambulatory Visit

## 2024-02-28 ENCOUNTER — Ambulatory Visit

## 2024-03-12 ENCOUNTER — Encounter (HOSPITAL_COMMUNITY): Payer: Self-pay | Admitting: Obstetrics and Gynecology

## 2024-03-12 ENCOUNTER — Other Ambulatory Visit: Payer: Self-pay

## 2024-03-12 ENCOUNTER — Inpatient Hospital Stay (HOSPITAL_COMMUNITY)

## 2024-03-12 ENCOUNTER — Inpatient Hospital Stay (HOSPITAL_COMMUNITY)
Admission: AD | Admit: 2024-03-12 | Discharge: 2024-03-14 | DRG: 832 | Disposition: A | Discharge status: Home / Self Care | Attending: Obstetrics and Gynecology | Admitting: Obstetrics and Gynecology

## 2024-03-12 DIAGNOSIS — O2343 Unspecified infection of urinary tract in pregnancy, third trimester: Secondary | ICD-10-CM | POA: Diagnosis not present

## 2024-03-12 DIAGNOSIS — Z87440 Personal history of urinary (tract) infections: Secondary | ICD-10-CM

## 2024-03-12 DIAGNOSIS — O99891 Other specified diseases and conditions complicating pregnancy: Secondary | ICD-10-CM | POA: Diagnosis present

## 2024-03-12 DIAGNOSIS — O2300 Infections of kidney in pregnancy, unspecified trimester: Secondary | ICD-10-CM | POA: Diagnosis present

## 2024-03-12 DIAGNOSIS — Z3A33 33 weeks gestation of pregnancy: Secondary | ICD-10-CM | POA: Diagnosis not present

## 2024-03-12 DIAGNOSIS — Z3689 Encounter for other specified antenatal screening: Secondary | ICD-10-CM | POA: Diagnosis not present

## 2024-03-12 DIAGNOSIS — N1330 Unspecified hydronephrosis: Secondary | ICD-10-CM | POA: Diagnosis present

## 2024-03-12 DIAGNOSIS — R109 Unspecified abdominal pain: Secondary | ICD-10-CM | POA: Diagnosis not present

## 2024-03-12 DIAGNOSIS — O2303 Infections of kidney in pregnancy, third trimester: Principal | ICD-10-CM

## 2024-03-12 LAB — COMPREHENSIVE METABOLIC PANEL WITH GFR
ALT: 11 U/L (ref 0–44)
AST: 17 U/L (ref 15–41)
Albumin: 2.5 g/dL — ABNORMAL LOW (ref 3.5–5.0)
Alkaline Phosphatase: 166 U/L — ABNORMAL HIGH (ref 38–126)
Anion gap: 8 (ref 5–15)
BUN: 7 mg/dL (ref 6–20)
CO2: 20 mmol/L — ABNORMAL LOW (ref 22–32)
Calcium: 8.5 mg/dL — ABNORMAL LOW (ref 8.9–10.3)
Chloride: 105 mmol/L (ref 98–111)
Creatinine, Ser: 0.61 mg/dL (ref 0.44–1.00)
GFR, Estimated: >60 mL/min (ref >60)
Glucose, Bld: 84 mg/dL (ref 70–99)
Potassium: 3.7 mmol/L (ref 3.5–5.1)
Sodium: 133 mmol/L — ABNORMAL LOW (ref 135–145)
Total Bilirubin: 0.6 mg/dL (ref 0.0–1.2)
Total Protein: 5.7 g/dL — ABNORMAL LOW (ref 6.5–8.1)

## 2024-03-12 LAB — URINALYSIS, ROUTINE W REFLEX MICROSCOPIC
Bilirubin Urine: NEGATIVE
Glucose, UA: NEGATIVE mg/dL
Ketones, ur: NEGATIVE mg/dL
Nitrite: NEGATIVE
Protein, ur: 100 mg/dL — AB
Specific Gravity, Urine: 1.004 — ABNORMAL LOW (ref 1.005–1.030)
WBC, UA: >50 WBC/hpf (ref 0–5)
pH: 6 (ref 5.0–8.0)

## 2024-03-12 LAB — CBC
HCT: 33.3 % — ABNORMAL LOW (ref 36.0–46.0)
Hemoglobin: 11.4 g/dL — ABNORMAL LOW (ref 12.0–15.0)
MCH: 30.2 pg (ref 26.0–34.0)
MCHC: 34.2 g/dL (ref 30.0–36.0)
MCV: 88.3 fL (ref 80.0–100.0)
Platelets: 244 K/uL (ref 150–400)
RBC: 3.77 MIL/uL — ABNORMAL LOW (ref 3.87–5.11)
RDW: 12.1 % (ref 11.5–15.5)
WBC: 11.3 K/uL — ABNORMAL HIGH (ref 4.0–10.5)
nRBC: 0 % (ref 0.0–0.2)

## 2024-03-12 LAB — TYPE AND SCREEN
ABO/RH(D): B POS
Antibody Screen: NEGATIVE

## 2024-03-12 MED ORDER — ACETAMINOPHEN 500 MG PO TABS
1000.0000 mg | ORAL_TABLET | Freq: Once | ORAL | Status: DC
  Filled 2024-03-12: qty 2

## 2024-03-12 MED ORDER — TAMSULOSIN HCL 0.4 MG PO CAPS
0.4000 mg | ORAL_CAPSULE | Freq: Every day | ORAL | Status: DC
  Administered 2024-03-12 – 2024-03-14 (×5): 0.4 mg via ORAL
  Filled 2024-03-12 (×3): qty 1

## 2024-03-12 MED ORDER — SODIUM CHLORIDE 0.9 % IV SOLN: INTRAVENOUS | Status: DC

## 2024-03-12 MED ORDER — CALCIUM CARBONATE ANTACID 500 MG PO CHEW: 2.0000 | CHEWABLE_TABLET | ORAL | Status: DC | PRN

## 2024-03-12 MED ORDER — SODIUM CHLORIDE 0.9 % IV BOLUS
250.0000 mL | Freq: Once | INTRAVENOUS | Status: AC
  Administered 2024-03-12 (×2): 250 mL via INTRAVENOUS

## 2024-03-12 MED ORDER — DOCUSATE SODIUM 100 MG PO CAPS
100.0000 mg | ORAL_CAPSULE | Freq: Every day | ORAL | Status: DC
  Administered 2024-03-12 – 2024-03-13 (×4): 100 mg via ORAL
  Filled 2024-03-12 (×2): qty 1

## 2024-03-12 MED ORDER — CEFADROXIL 500 MG PO CAPS: 1000.0000 mg | ORAL_CAPSULE | Freq: Two times a day (BID) | ORAL | 0 refills | Status: DC

## 2024-03-12 MED ORDER — LACTATED RINGERS IV SOLN: INTRAVENOUS | Status: DC

## 2024-03-12 MED ORDER — PRENATAL MULTIVITAMIN CH
1.0000 | ORAL_TABLET | Freq: Every day | ORAL | Status: DC
  Administered 2024-03-12 – 2024-03-13 (×4): 1 via ORAL
  Filled 2024-03-12 (×2): qty 1

## 2024-03-12 MED ORDER — OXYCODONE HCL 5 MG PO TABS
10.0000 mg | ORAL_TABLET | Freq: Once | ORAL | Status: DC
  Filled 2024-03-12: qty 2

## 2024-03-12 MED ORDER — CEFADROXIL 500 MG PO CAPS: 500.0000 mg | ORAL_CAPSULE | Freq: Two times a day (BID) | ORAL | 0 refills | Status: DC

## 2024-03-12 MED ORDER — SODIUM CHLORIDE 0.9 % IV SOLN
2.0000 g | INTRAVENOUS | Status: DC
  Administered 2024-03-13 – 2024-03-14 (×3): 2 g via INTRAVENOUS
  Filled 2024-03-12 (×2): qty 20

## 2024-03-12 MED ORDER — ACETAMINOPHEN 325 MG PO TABS: 650.0000 mg | ORAL_TABLET | ORAL | Status: DC | PRN

## 2024-03-12 MED ORDER — OXYCODONE HCL 5 MG PO TABS: 5.0000 mg | ORAL_TABLET | ORAL | Status: DC | PRN

## 2024-03-12 MED ORDER — LACTATED RINGERS IV SOLN: 125.0000 mL/h | INTRAVENOUS | Status: AC

## 2024-03-12 MED ORDER — SODIUM CHLORIDE 0.9 % IV SOLN
2.0000 g | Freq: Once | INTRAVENOUS | Status: AC
  Administered 2024-03-12 (×2): 2 g via INTRAVENOUS
  Filled 2024-03-12: qty 20

## 2024-03-12 NOTE — MAU Provider Note (Addendum)
 MAU Provider Note  Chief Complaint: Back Pain and Nausea  SUBJECTIVE HPI: Kathryn Clark is a 23 y.o. G3P1011 at [redacted]w[redacted]d by LMP who presents to maternity admissions reporting UTI symptoms. Pregnancy c/b recurrent UTI. Receives [facility] with [facility].  Patient notes dysuria, increased urinary frequency past 2 days. Has now developed right flank pain which refers down back and up to shoulder. Was supposed to be taking keflex  for UTI prophylaxis (4 prior UTIs this pregnancy), however was unclear on directions. Denies fever/chills, change in discharge, VB, LOF. +FM.  HPI  Past Medical History:  Diagnosis Date   Arrest of dilation, delivered, current hospitalization 03/17/2019   Status post cesarean delivery 03/17/2019   Past Surgical History:  Procedure Laterality Date   CESAREAN SECTION N/A 03/17/2019   Procedure: CESAREAN SECTION;  Surgeon: Delana Ted Morrison, DO;  Location: [facility] LD ORS;  Service: Obstetrics;  Laterality: N/A;   Social History   Socioeconomic History   Marital status: Single    Spouse name: Not on file   Number of children: 1   Years of education: Not on file   Highest education level: Not on file  Occupational History   Not on file  Tobacco Use   Smoking status: Never   Smokeless tobacco: Never  Vaping Use   Vaping status: Never Used  Substance and Sexual Activity   Alcohol use: No   Drug use: No   Sexual activity: Yes    Birth control/protection: None  Other Topics Concern   Not on file  Social History Narrative   Not on file   Social Drivers of Health   Financial Resource Strain: Not on file  Food Insecurity: No Food Insecurity (02/03/2024)   Hunger Vital Sign    Worried About Running Out of Food in the Last Year: Never true    Ran Out of Food in the Last Year: Never true  Transportation Needs: No Transportation Needs (02/03/2024)   PRAPARE - Administrator, Civil Service (Medical): No    Lack of Transportation (Non-Medical): No   Physical Activity: Not on file  Stress: Not on file  Social Connections: Unknown (11/15/2022)   Received from [facility]   Social Network    Social Network: Not on file  Intimate Partner Violence: Not At Risk (02/03/2024)   Humiliation, Afraid, Rape, and Kick questionnaire    Fear of Current or Ex-Partner: No    Emotionally Abused: No    Physically Abused: No    Sexually Abused: No   No current facility-administered medications on file prior to encounter.   Current Outpatient Medications on File Prior to Encounter  Medication Sig Dispense Refill   cephALEXin  (KEFLEX ) 500 MG capsule Take 1 capsule (500 mg total) by mouth daily. Start after 7 days of cefadroxil . 30 capsule 2   ondansetron  (ZOFRAN -ODT) 4 MG disintegrating tablet Take 1 tablet (4 mg total) by mouth every 8 (eight) hours as needed for nausea or vomiting. 20 tablet 0   oxyCODONE -acetaminophen  (PERCOCET/ROXICET) 5-325 MG tablet Take 1-2 tablets by mouth every 6 (six) hours as needed for severe pain (pain score 7-10). 6 tablet 0   phenazopyridine  (PYRIDIUM ) 200 MG tablet Take 1 tablet (200 mg total) by mouth 3 (three) times daily as needed for pain (urethral spasm). 10 tablet 1   Prenatal Vit-Fe Fumarate-FA (MULTIVITAMIN-PRENATAL) 27-0.8 MG TABS tablet Take 1 tablet by mouth daily at 12 noon.     [DISCONTINUED] labetalol  (NORMODYNE ) 200 MG tablet Take 1 tablet (200 mg  total) by mouth 2 (two) times daily. 60 tablet 0   [DISCONTINUED] promethazine  (PHENERGAN ) 12.5 MG tablet Take 1 tablet (12.5 mg total) by mouth every 6 (six) hours as needed for nausea or vomiting. 30 tablet 0   [DISCONTINUED] ranitidine  (ZANTAC ) 150 MG tablet Take 1 tablet (150 mg total) by mouth 2 (two) times daily. 60 tablet 0   Allergies  Allergen Reactions   Pataday  [Olopatadine  Hcl] Other (See Comments)    Redness and swelling to eye     ROS:  Pertinent positives/negatives listed above.  I have reviewed patient's Past Medical Hx, Surgical Hx, Family  Hx, Social Hx, medications and allergies.   Physical Exam  Patient Vitals for the past 24 hrs:  BP Temp Temp src Pulse Resp SpO2 Height Weight  03/12/24 0732 (!) 99/50 97.9 F (36.6 C) Oral 77 18 99 % -- --  03/12/24 0436 121/76 97.9 F (36.6 C) Oral 81 16 100 % 5' 5 (1.651 m) 100.4 kg   Constitutional: Well-developed, well-nourished female. Appears uncomfortable  Cardiovascular: normal rate Respiratory: normal effort GI: Abd soft, mild suprapubic tenderness, gravid MS: Extremities nontender, no edema, normal ROM Neurologic: Alert and oriented x 4  GU: +R CVAT. None L  FHT:  Baseline 130, moderate variability, accelerations present, no decelerations Contractions: none  LAB RESULTS Results for orders placed or performed during the hospital encounter of 03/12/24 (from the past 24 hours)  Urinalysis, Routine w reflex microscopic -Urine, Clean Catch     Status: Abnormal   Collection Time: 03/12/24  5:00 AM  Result Value Ref Range   Color, Urine YELLOW YELLOW   APPearance CLOUDY (A) CLEAR   Specific Gravity, Urine 1.004 (L) 1.005 - 1.030   pH 6.0 5.0 - 8.0   Glucose, UA NEGATIVE NEGATIVE mg/dL   Hgb urine dipstick MODERATE (A) NEGATIVE   Bilirubin Urine NEGATIVE NEGATIVE   Ketones, ur NEGATIVE NEGATIVE mg/dL   Protein, ur 899 (A) NEGATIVE mg/dL   Nitrite NEGATIVE NEGATIVE   Leukocytes,Ua LARGE (A) NEGATIVE   RBC / HPF 0-5 0 - 5 RBC/hpf   WBC, UA >50 0 - 5 WBC/hpf   Bacteria, UA RARE (A) NONE SEEN   Squamous Epithelial / HPF 0-5 0 - 5 /HPF   WBC Clumps PRESENT    Mucus PRESENT   CBC     Status: Abnormal   Collection Time: 03/12/24  5:47 AM  Result Value Ref Range   WBC 11.3 (H) 4.0 - 10.5 K/uL   RBC 3.77 (L) 3.87 - 5.11 MIL/uL   Hemoglobin 11.4 (L) 12.0 - 15.0 g/dL   HCT 66.6 (L) 63.9 - 53.9 %   MCV 88.3 80.0 - 100.0 fL   MCH 30.2 26.0 - 34.0 pg   MCHC 34.2 30.0 - 36.0 g/dL   RDW 87.8 88.4 - 84.4 %   Platelets 244 150 - 400 K/uL   nRBC 0.0 0.0 - 0.2 %  Comprehensive  metabolic panel     Status: Abnormal   Collection Time: 03/12/24  5:47 AM  Result Value Ref Range   Sodium 133 (L) 135 - 145 mmol/L   Potassium 3.7 3.5 - 5.1 mmol/L   Chloride 105 98 - 111 mmol/L   CO2 20 (L) 22 - 32 mmol/L   Glucose, Bld 84 70 - 99 mg/dL   BUN 7 6 - 20 mg/dL   Creatinine, Ser 9.38 0.44 - 1.00 mg/dL   Calcium  8.5 (L) 8.9 - 10.3 mg/dL   Total Protein 5.7 (L)  6.5 - 8.1 g/dL   Albumin 2.5 (L) 3.5 - 5.0 g/dL   AST 17 15 - 41 U/L   ALT 11 0 - 44 U/L   Alkaline Phosphatase 166 (H) 38 - 126 U/L   Total Bilirubin 0.6 0.0 - 1.2 mg/dL   GFR, Estimated >39 >39 mL/min   Anion gap 8 5 - 15       IMAGING US  RENAL Result Date: 03/12/2024 CLINICAL DATA:  Pregnant patient with flank pain. EXAM: RENAL / URINARY TRACT ULTRASOUND COMPLETE COMPARISON:  None Available. FINDINGS: Right Kidney: Renal measurements: 12.5 x 6.7 x 7.5 cm = volume: 324.8 mL. Normal parenchymal echogenicity. Moderate hydronephrosis. No mass or stone. Left Kidney: Renal measurements: 11.9 x 4.3 x 5.0 cm = volume: 134.9 mL. Echogenicity within normal limits. No mass or hydronephrosis visualized. Bladder: Appears normal for degree of bladder distention. Other: None. IMPRESSION: 1. Moderate right hydronephrosis. This may be due to a distal right ureteral stone. Extrinsic compression of the distal right ureter from the gravid uterus is also a possible etiology. Consider follow-up abdomen and pelvis CT without contrast further assessment. Electronically Signed   By: Alm Parkins M.D.   On: 03/12/2024 08:01    MAU Management/MDM: Orders Placed This Encounter  Procedures   Culture, OB Urine   US  RENAL   Urinalysis, Routine w reflex microscopic -Urine, Clean Catch   CBC   Comprehensive metabolic panel    Meds ordered this encounter  Medications   DISCONTD: acetaminophen  (TYLENOL ) tablet 1,000 mg   oxyCODONE  (Oxy IR/ROXICODONE ) immediate release tablet 10 mg    Refill:  0   cefTRIAXone  (ROCEPHIN ) 2 g in sodium  chloride 0.9 % 100 mL IVPB    Antibiotic Indication::   UTI   sodium chloride  0.9 % bolus 250 mL   cefadroxil  (DURICEF) 500 MG capsule    Sig: Take 1 capsule (500 mg total) by mouth 2 (two) times daily.    Dispense:  20 capsule    Refill:  0     Available prenatal records reviewed.  Patient presents with UTI/pyelonephritis symptoms in setting of recurrent UTI at [redacted]w[redacted]d gestation. She is not febrile, does not meet sepsis criteria, however does have flank pain which radiates. Suspect early/developing pyelonephritis. UA confirms infection. Do have less suspicion for nephrolithiasis, however will fully evaluate with renal U/S. Patient took tylenol  at home; declines narcotics for pain 2/2 side effects. Will give CTX 2 g while in MAU (last available culture pan sensitive E. Coli).  WBC 11.3. Normal kidney function with Cr 0.61. Will send home with duricef x10 days to complete treatment for pyelo. Will start on keflex  for prophylaxis after.  Dr. Sudie called and agreed with plan of care. If patient develops fever, would need to return for admission for full course of IV antibiotics.  Pending renal ultrasound. Care transferred to [place], [place], [phone].  ASSESSMENT 1. Pyelonephritis affecting pregnancy in third trimester   2. Recurrent urinary tract infection affecting pregnancy in third trimester   3. NST (non-stress test) reactive   4. [redacted] weeks gestation of pregnancy     PLAN Discharge home with strict return precautions. Allergies as of 03/12/2024       Reactions   Pataday  [olopatadine  Hcl] Other (See Comments)   Redness and swelling to eye        Medication List     STOP taking these medications    oxyCODONE -acetaminophen  5-325 MG tablet Commonly known as: PERCOCET/ROXICET  TAKE these medications    cefadroxil  500 MG capsule Commonly known as: DURICEF Take 1 capsule (500 mg total) by mouth 2 (two) times daily.   cephALEXin  500 MG capsule Commonly known as: KEFLEX  Take  1 capsule (500 mg total) by mouth daily. Start after 7 days of cefadroxil .   multivitamin-prenatal 27-0.8 MG Tabs tablet Take 1 tablet by mouth daily at 12 noon.   ondansetron  4 MG disintegrating tablet Commonly known as: ZOFRAN -ODT Take 1 tablet (4 mg total) by mouth every 8 (eight) hours as needed for nausea or vomiting.   phenazopyridine  200 MG tablet Commonly known as: Pyridium  Take 1 tablet (200 mg total) by mouth 3 (three) times daily as needed for pain (urethral spasm).         Almarie Moats, MD OB Fellow 03/12/2024  9:09 AM  ------------------------------------------------------------------------------------------------------------- I received care of this patient at 0800 ( Renal scan pending)  @ 0900 Discussed findings with Dr Eveline [facility] Attending) - Recommend admission for Pyelo with questionable right  renal lithiasis.  @ 1014 discussed with Dr Okey [facility] Attending) Plan for Admission to antepartum for pyelonephritis in pregnancy - Routine admission orders placed Remaining orders per Dr Okey ---------------------------------------------Olam Dalton, MSN, [facility], [facility] for [facility]

## 2024-03-12 NOTE — Discharge Instructions (Addendum)
 Take keflex  everyday for prevention of further infections.

## 2024-03-12 NOTE — MAU Provider Note (Cosign Needed Addendum)
 MAU Provider Note  Chief Complaint: Back Pain and Nausea  SUBJECTIVE HPI: Kathryn Clark is a 23 y.o. G3P1011 at [redacted]w[redacted]d by LMP who presents to maternity admissions reporting UTI symptoms. Pregnancy c/b recurrent UTI. Receives [facility] with [facility].  Patient notes dysuria, increased urinary frequency past 2 days. Has now developed right flank pain which refers down back and up to shoulder. Was supposed to be taking keflex  for UTI prophylaxis (4 prior UTIs this pregnancy), however was unclear on directions. Denies fever/chills, change in discharge, VB, LOF. +FM.  HPI  Past Medical History:  Diagnosis Date   Arrest of dilation, delivered, current hospitalization 03/17/2019   Status post cesarean delivery 03/17/2019   Past Surgical History:  Procedure Laterality Date   CESAREAN SECTION N/A 03/17/2019   Procedure: CESAREAN SECTION;  Surgeon: Delana Ted Morrison, DO;  Location: [facility] LD ORS;  Service: Obstetrics;  Laterality: N/A;   Social History   Socioeconomic History   Marital status: Single    Spouse name: Not on file   Number of children: 1   Years of education: Not on file   Highest education level: Not on file  Occupational History   Not on file  Tobacco Use   Smoking status: Never   Smokeless tobacco: Never  Vaping Use   Vaping status: Never Used  Substance and Sexual Activity   Alcohol use: No   Drug use: No   Sexual activity: Yes    Birth control/protection: None  Other Topics Concern   Not on file  Social History Narrative   Not on file   Social Drivers of Health   Financial Resource Strain: Not on file  Food Insecurity: No Food Insecurity (02/03/2024)   Hunger Vital Sign    Worried About Running Out of Food in the Last Year: Never true    Ran Out of Food in the Last Year: Never true  Transportation Needs: No Transportation Needs (02/03/2024)   PRAPARE - Administrator, Civil Service (Medical): No    Lack of Transportation (Non-Medical): No   Physical Activity: Not on file  Stress: Not on file  Social Connections: Unknown (11/15/2022)   Received from [facility]   Social Network    Social Network: Not on file  Intimate Partner Violence: Not At Risk (02/03/2024)   Humiliation, Afraid, Rape, and Kick questionnaire    Fear of Current or Ex-Partner: No    Emotionally Abused: No    Physically Abused: No    Sexually Abused: No   No current facility-administered medications on file prior to encounter.   Current Outpatient Medications on File Prior to Encounter  Medication Sig Dispense Refill   cephALEXin  (KEFLEX ) 500 MG capsule Take 1 capsule (500 mg total) by mouth daily. Start after 7 days of cefadroxil . 30 capsule 2   ondansetron  (ZOFRAN -ODT) 4 MG disintegrating tablet Take 1 tablet (4 mg total) by mouth every 8 (eight) hours as needed for nausea or vomiting. 20 tablet 0   oxyCODONE -acetaminophen  (PERCOCET/ROXICET) 5-325 MG tablet Take 1-2 tablets by mouth every 6 (six) hours as needed for severe pain (pain score 7-10). 6 tablet 0   phenazopyridine  (PYRIDIUM ) 200 MG tablet Take 1 tablet (200 mg total) by mouth 3 (three) times daily as needed for pain (urethral spasm). 10 tablet 1   Prenatal Vit-Fe Fumarate-FA (MULTIVITAMIN-PRENATAL) 27-0.8 MG TABS tablet Take 1 tablet by mouth daily at 12 noon.     [DISCONTINUED] labetalol  (NORMODYNE ) 200 MG tablet Take 1 tablet (200 mg  total) by mouth 2 (two) times daily. 60 tablet 0   [DISCONTINUED] promethazine  (PHENERGAN ) 12.5 MG tablet Take 1 tablet (12.5 mg total) by mouth every 6 (six) hours as needed for nausea or vomiting. 30 tablet 0   [DISCONTINUED] ranitidine  (ZANTAC ) 150 MG tablet Take 1 tablet (150 mg total) by mouth 2 (two) times daily. 60 tablet 0   Allergies  Allergen Reactions   Pataday  [Olopatadine  Hcl] Other (See Comments)    Redness and swelling to eye     ROS:  Pertinent positives/negatives listed above.  I have reviewed patient's Past Medical Hx, Surgical Hx, Family  Hx, Social Hx, medications and allergies.   Physical Exam  Patient Vitals for the past 24 hrs:  BP Temp Temp src Pulse Resp SpO2 Height Weight  03/12/24 0732 (!) 99/50 97.9 F (36.6 C) Oral 77 18 99 % -- --  03/12/24 0436 121/76 97.9 F (36.6 C) Oral 81 16 100 % 5' 5 (1.651 m) 100.4 kg   Constitutional: Well-developed, well-nourished female. Appears uncomfortable  Cardiovascular: normal rate Respiratory: normal effort GI: Abd soft, mild suprapubic tenderness, gravid MS: Extremities nontender, no edema, normal ROM Neurologic: Alert and oriented x 4  GU: +R CVAT. None L  FHT:  Baseline 130, moderate variability, accelerations present, no decelerations Contractions: none  LAB RESULTS Results for orders placed or performed during the hospital encounter of 03/12/24 (from the past 24 hours)  Urinalysis, Routine w reflex microscopic -Urine, Clean Catch     Status: Abnormal   Collection Time: 03/12/24  5:00 AM  Result Value Ref Range   Color, Urine YELLOW YELLOW   APPearance CLOUDY (A) CLEAR   Specific Gravity, Urine 1.004 (L) 1.005 - 1.030   pH 6.0 5.0 - 8.0   Glucose, UA NEGATIVE NEGATIVE mg/dL   Hgb urine dipstick MODERATE (A) NEGATIVE   Bilirubin Urine NEGATIVE NEGATIVE   Ketones, ur NEGATIVE NEGATIVE mg/dL   Protein, ur 899 (A) NEGATIVE mg/dL   Nitrite NEGATIVE NEGATIVE   Leukocytes,Ua LARGE (A) NEGATIVE   RBC / HPF 0-5 0 - 5 RBC/hpf   WBC, UA >50 0 - 5 WBC/hpf   Bacteria, UA RARE (A) NONE SEEN   Squamous Epithelial / HPF 0-5 0 - 5 /HPF   WBC Clumps PRESENT    Mucus PRESENT   CBC     Status: Abnormal   Collection Time: 03/12/24  5:47 AM  Result Value Ref Range   WBC 11.3 (H) 4.0 - 10.5 K/uL   RBC 3.77 (L) 3.87 - 5.11 MIL/uL   Hemoglobin 11.4 (L) 12.0 - 15.0 g/dL   HCT 66.6 (L) 63.9 - 53.9 %   MCV 88.3 80.0 - 100.0 fL   MCH 30.2 26.0 - 34.0 pg   MCHC 34.2 30.0 - 36.0 g/dL   RDW 87.8 88.4 - 84.4 %   Platelets 244 150 - 400 K/uL   nRBC 0.0 0.0 - 0.2 %  Comprehensive  metabolic panel     Status: Abnormal   Collection Time: 03/12/24  5:47 AM  Result Value Ref Range   Sodium 133 (L) 135 - 145 mmol/L   Potassium 3.7 3.5 - 5.1 mmol/L   Chloride 105 98 - 111 mmol/L   CO2 20 (L) 22 - 32 mmol/L   Glucose, Bld 84 70 - 99 mg/dL   BUN 7 6 - 20 mg/dL   Creatinine, Ser 9.38 0.44 - 1.00 mg/dL   Calcium  8.5 (L) 8.9 - 10.3 mg/dL   Total Protein 5.7 (L)  6.5 - 8.1 g/dL   Albumin 2.5 (L) 3.5 - 5.0 g/dL   AST 17 15 - 41 U/L   ALT 11 0 - 44 U/L   Alkaline Phosphatase 166 (H) 38 - 126 U/L   Total Bilirubin 0.6 0.0 - 1.2 mg/dL   GFR, Estimated >39 >39 mL/min   Anion gap 8 5 - 15       IMAGING US  RENAL Result Date: 03/12/2024 CLINICAL DATA:  Pregnant patient with flank pain. EXAM: RENAL / URINARY TRACT ULTRASOUND COMPLETE COMPARISON:  None Available. FINDINGS: Right Kidney: Renal measurements: 12.5 x 6.7 x 7.5 cm = volume: 324.8 mL. Normal parenchymal echogenicity. Moderate hydronephrosis. No mass or stone. Left Kidney: Renal measurements: 11.9 x 4.3 x 5.0 cm = volume: 134.9 mL. Echogenicity within normal limits. No mass or hydronephrosis visualized. Bladder: Appears normal for degree of bladder distention. Other: None. IMPRESSION: 1. Moderate right hydronephrosis. This may be due to a distal right ureteral stone. Extrinsic compression of the distal right ureter from the gravid uterus is also a possible etiology. Consider follow-up abdomen and pelvis CT without contrast further assessment. Electronically Signed   By: Alm Parkins M.D.   On: 03/12/2024 08:01    MAU Management/MDM: Orders Placed This Encounter  Procedures   Culture, OB Urine   US  RENAL   Urinalysis, Routine w reflex microscopic -Urine, Clean Catch   CBC   Comprehensive metabolic panel    Meds ordered this encounter  Medications   DISCONTD: acetaminophen  (TYLENOL ) tablet 1,000 mg   oxyCODONE  (Oxy IR/ROXICODONE ) immediate release tablet 10 mg    Refill:  0   cefTRIAXone  (ROCEPHIN ) 2 g in sodium  chloride 0.9 % 100 mL IVPB    Antibiotic Indication::   UTI   sodium chloride  0.9 % bolus 250 mL   cefadroxil  (DURICEF) 500 MG capsule    Sig: Take 1 capsule (500 mg total) by mouth 2 (two) times daily.    Dispense:  20 capsule    Refill:  0     Available prenatal records reviewed.  Patient presents with UTI/pyelonephritis symptoms in setting of recurrent UTI at [redacted]w[redacted]d gestation. She is not febrile, does not meet sepsis criteria, however does have flank pain which radiates. Suspect early/developing pyelonephritis. UA confirms infection. Do have less suspicion for nephrolithiasis, however will fully evaluate with renal U/S. Patient took tylenol  at home; declines narcotics for pain 2/2 side effects. Will give CTX 2 g while in MAU (last available culture pan sensitive E. Coli).  WBC 11.3. Normal kidney function with Cr 0.61. Will send home with duricef x10 days to complete treatment for pyelo. Will start on keflex  for prophylaxis after.  Dr. Sudie called and agreed with plan of care. If patient develops fever, would need to return for admission for full course of IV antibiotics.  Pending renal ultrasound. Care transferred to [place], [place], [phone].  ASSESSMENT 1. Pyelonephritis affecting pregnancy in third trimester   2. Recurrent urinary tract infection affecting pregnancy in third trimester   3. NST (non-stress test) reactive   4. [redacted] weeks gestation of pregnancy     PLAN Discharge home with strict return precautions. Allergies as of 03/12/2024       Reactions   Pataday  [olopatadine  Hcl] Other (See Comments)   Redness and swelling to eye        Medication List     STOP taking these medications    oxyCODONE -acetaminophen  5-325 MG tablet Commonly known as: PERCOCET/ROXICET  TAKE these medications    cefadroxil  500 MG capsule Commonly known as: DURICEF Take 1 capsule (500 mg total) by mouth 2 (two) times daily.   cephALEXin  500 MG capsule Commonly known as: KEFLEX  Take  1 capsule (500 mg total) by mouth daily. Start after 7 days of cefadroxil .   multivitamin-prenatal 27-0.8 MG Tabs tablet Take 1 tablet by mouth daily at 12 noon.   ondansetron  4 MG disintegrating tablet Commonly known as: ZOFRAN -ODT Take 1 tablet (4 mg total) by mouth every 8 (eight) hours as needed for nausea or vomiting.   phenazopyridine  200 MG tablet Commonly known as: Pyridium  Take 1 tablet (200 mg total) by mouth 3 (three) times daily as needed for pain (urethral spasm).         Almarie Moats, MD OB Fellow 03/12/2024  9:09 AM  ------------------------------------------------------------------------------------------------------------- I received care of this patient at 0800 ( Renal scan pending)  @ 0900 Discussed findings with Dr Eveline [facility] Attending) - Recommend admission for Pyelo with questionable right  renal lithiasis.  @ 1014 discussed with Dr Okey [facility] Attending) Plan for Admission to antepartum for pyelonephritis in pregnancy - Routine admission orders placed Remaining orders per Dr Okey ---------------------------------------------Olam Dalton, MSN, [facility], [facility] for [facility]

## 2024-03-12 NOTE — H&P (Signed)
 Kathryn Clark is a 23 y.o. female presenting for recurrent UTI symptoms  23 year old G3 P1-0-1-1 at 33+1 presents complaining of left flank pain, shoulder pain and nausea.  Patient has received treatment for multiple UTIs in this pregnancy.  She reportedly was to be on prophylactic cephalexin  as of 02/03/2024 based on urine analysis and MAU consistent with UTI.  No culture was sent on this visit.  There is not a notation in the patient's office chart regarding treatment plan of continue prophylactic antibiotics.  Patient reported symptoms consistent with a UTI and restarted taking some antibiotics that she had at home 2 days ago.  Her symptoms worsened and she presented to MAU.  Patient was recently seen for routine prenatal care on 8/5.  She was complaining of abnormal vaginal discharge at that time and was tested for BV, yeast, GC, chlamydia, trichomoniasis.  All were negative.  In MAU patient's urine analysis showed moderate hemoglobin, large leukocytes, 1+ protein, and rare bacteria.  Urine culture history: 1) 09/28/2023: Mixed urogenital flora 10-25K CFU  2) 10/17/2023: Negative (Office) 3) 10/16/2023: Positive for E. coli greater than 100k, pansensitive (URGENT CARE) 4) 11/24/2023: E. coli 50-100,000 K pansensitive 5) 01/01/2024: 25K lactobacilli, considered normal flora and not infection 6) 02/03/2024: Urinalysis in MAU moderate hemoglobin, large leuks, 1+ protein, rare bacteria.  No urine culture sent OB History     Gravida  3   Para  1   Term  1   Preterm      AB  1   Living  1      SAB      IAB  0   Ectopic      Multiple  0   Live Births  1          Past Medical History:  Diagnosis Date   Arrest of dilation, delivered, current hospitalization 03/17/2019   Status post cesarean delivery 03/17/2019   Past Surgical History:  Procedure Laterality Date   CESAREAN SECTION N/A 03/17/2019   Procedure: CESAREAN SECTION;  Surgeon: Delana Ted Morrison, DO;  Location: [facility] LD [facility]S;   Service: Obstetrics;  Laterality: N/A;   Family History: family history includes Healthy in her father and mother. Social History:  reports that she has never smoked. She has never used smokeless tobacco. She reports that she does not drink alcohol and does not use drugs.     Maternal Diabetes: No Genetic Screening: Normal Maternal Ultrasounds/Referrals: Normal Fetal Ultrasounds or other Referrals:  None Maternal Substance Abuse:  No Significant Maternal Medications:  None Significant Maternal Lab Results:  None Number of Prenatal Visits:greater than 3 verified prenatal visits Other Comments:  None  Review of Systems History   Blood pressure 106/63, pulse 87, temperature 98.2 F (36.8 C), temperature source Oral, resp. rate 18, height 5' 5 (1.651 m), weight 100.4 kg, last menstrual period 07/27/2023, SpO2 100%, unknown if currently breastfeeding. Exam Physical Exam  X 3, no apparent distress Gravid, soft Prenatal labs: ABO, Rh: --/--/B POS (08/12 1047) Antibody: NEG (08/12 1047) Rubella:  Immune RPR: NON REACTIVE (02/04 1536)  HBsAg:   Nonreactive HIV:  Nonreactive GBS:   Not yet done Results for orders placed or performed during the hospital encounter of 03/12/24 (from the past 24 hours)  Urinalysis, Routine w reflex microscopic -Urine, Clean Catch     Status: Abnormal   Collection Time: 03/12/24  5:00 AM  Result Value Ref Range   Color, Urine YELLOW YELLOW   APPearance CLOUDY (A) CLEAR  Specific Gravity, Urine 1.004 (L) 1.005 - 1.030   pH 6.0 5.0 - 8.0   Glucose, UA NEGATIVE NEGATIVE mg/dL   Hgb urine dipstick MODERATE (A) NEGATIVE   Bilirubin Urine NEGATIVE NEGATIVE   Ketones, ur NEGATIVE NEGATIVE mg/dL   Protein, ur 899 (A) NEGATIVE mg/dL   Nitrite NEGATIVE NEGATIVE   Leukocytes,Ua LARGE (A) NEGATIVE   RBC / HPF 0-5 0 - 5 RBC/hpf   WBC, UA >50 0 - 5 WBC/hpf   Bacteria, UA RARE (A) NONE SEEN   Squamous Epithelial / HPF 0-5 0 - 5 /HPF   WBC Clumps PRESENT     Mucus PRESENT   CBC     Status: Abnormal   Collection Time: 03/12/24  5:47 AM  Result Value Ref Range   WBC 11.3 (H) 4.0 - 10.5 K/uL   RBC 3.77 (L) 3.87 - 5.11 MIL/uL   Hemoglobin 11.4 (L) 12.0 - 15.0 g/dL   HCT 66.6 (L) 63.9 - 53.9 %   MCV 88.3 80.0 - 100.0 fL   MCH 30.2 26.0 - 34.0 pg   MCHC 34.2 30.0 - 36.0 g/dL   RDW 87.8 88.4 - 84.4 %   Platelets 244 150 - 400 K/uL   nRBC 0.0 0.0 - 0.2 %  Comprehensive metabolic panel     Status: Abnormal   Collection Time: 03/12/24  5:47 AM  Result Value Ref Range   Sodium 133 (L) 135 - 145 mmol/L   Potassium 3.7 3.5 - 5.1 mmol/L   Chloride 105 98 - 111 mmol/L   CO2 20 (L) 22 - 32 mmol/L   Glucose, Bld 84 70 - 99 mg/dL   BUN 7 6 - 20 mg/dL   Creatinine, Ser 9.38 0.44 - 1.00 mg/dL   Calcium  8.5 (L) 8.9 - 10.3 mg/dL   Total Protein 5.7 (L) 6.5 - 8.1 g/dL   Albumin 2.5 (L) 3.5 - 5.0 g/dL   AST 17 15 - 41 U/L   ALT 11 0 - 44 U/L   Alkaline Phosphatase 166 (H) 38 - 126 U/L   Total Bilirubin 0.6 0.0 - 1.2 mg/dL   GFR, Estimated >39 >39 mL/min   Anion gap 8 5 - 15  Type and screen [facility]     Status: None   Collection Time: 03/12/24 10:47 AM  Result Value Ref Range   ABO/RH(D) B POS    Antibody Screen NEG    Sample Expiration      03/15/2024,2359 Performed at [facility] Lab, [address], [place], [place] [address]    US  RENAL 03/12/2024  Narrative CLINICAL DATA:  Pregnant patient with flank pain.  EXAM: RENAL / URINARY TRACT ULTRASOUND COMPLETE  COMPARISON:  None Available.  FINDINGS: Right Kidney:  Renal measurements: 12.5 x 6.7 x 7.5 cm = volume: 324.8 mL. Normal parenchymal echogenicity. Moderate hydronephrosis. No mass or stone.  Left Kidney:  Renal measurements: 11.9 x 4.3 x 5.0 cm = volume: 134.9 mL. Echogenicity within normal limits. No mass or hydronephrosis visualized.  Bladder:  Appears normal for degree of bladder distention.  Other:  None.  IMPRESSION: 1. Moderate right  hydronephrosis. This may be due to a distal right ureteral stone. Extrinsic compression of the distal right ureter from the gravid uterus is also a possible etiology. Consider follow-up abdomen and pelvis CT without contrast further assessment.   Electronically Signed By: Alm Parkins M.D. On: 03/12/2024 08:01   Assessment/Plan: 1) admit 23-hour obs 2) pyelonephritis versus nephrolithiasis: Renal  ultrasound shows moderate right hydronephrosis may be due to distal ureteral stone versus physiologic secondary to gravid uterus.  No mass or stone was actually visualized with in the ureter.  Given pregnancy and likelihood of physiologic right hydronephrosis we will hold off on pursuing CT stone study for now.  Will strain urine, daily tamsulosin  0.4 mg 3) urine culture pending 4) Rocephin  2 g IV every 24 hours 5) NST daily, Doppler fetal heart tones every shift  Marjorie Gull 03/12/2024, 4:46 PM

## 2024-03-12 NOTE — MAU Note (Signed)
 MAU Triage Note:  .Kathryn Clark is a 23 y.o. at [redacted]w[redacted]d here in MAU reporting: reporting kidney pain, shoulder pain, and nausea. The pain is sharp and radiating in her left flank area. 2 days ago started taking leftover ABX from a prior UTI. She does report urinary frequency and urgency. Denies VB or LOF. Reports +FM.  Patient complaint: kidney and shoulder pain  Pain Score: 10-Worst pain ever Pain Location: Flank     Onset of complaint: yesterday LMP: Patient's last menstrual period was 07/27/2023.  Vitals:   03/12/24 0436  BP: 121/76  Pulse: 81  Resp: 16  Temp: 97.9 F (36.6 C)  SpO2: 100%    FHT:  Fetal Heart Rate Mode: Doppler Baseline Rate (A): 132 bpm Lab orders placed from triage: UA

## 2024-03-13 ENCOUNTER — Encounter (HOSPITAL_COMMUNITY): Payer: Self-pay | Admitting: Obstetrics and Gynecology

## 2024-03-13 DIAGNOSIS — Z87440 Personal history of urinary (tract) infections: Secondary | ICD-10-CM | POA: Diagnosis not present

## 2024-03-13 DIAGNOSIS — Z3A33 33 weeks gestation of pregnancy: Secondary | ICD-10-CM | POA: Diagnosis not present

## 2024-03-13 DIAGNOSIS — O99891 Other specified diseases and conditions complicating pregnancy: Secondary | ICD-10-CM | POA: Diagnosis present

## 2024-03-13 DIAGNOSIS — O2303 Infections of kidney in pregnancy, third trimester: Secondary | ICD-10-CM | POA: Diagnosis present

## 2024-03-13 DIAGNOSIS — R109 Unspecified abdominal pain: Secondary | ICD-10-CM | POA: Diagnosis present

## 2024-03-13 DIAGNOSIS — N1330 Unspecified hydronephrosis: Secondary | ICD-10-CM | POA: Diagnosis present

## 2024-03-13 LAB — CBC
HCT: 31.4 % — ABNORMAL LOW (ref 36.0–46.0)
Hemoglobin: 10.8 g/dL — ABNORMAL LOW (ref 12.0–15.0)
MCH: 30.5 pg (ref 26.0–34.0)
MCHC: 34.4 g/dL (ref 30.0–36.0)
MCV: 88.7 fL (ref 80.0–100.0)
Platelets: 215 K/uL (ref 150–400)
RBC: 3.54 MIL/uL — ABNORMAL LOW (ref 3.87–5.11)
RDW: 12.4 % (ref 11.5–15.5)
WBC: 7.7 K/uL (ref 4.0–10.5)
nRBC: 0 % (ref 0.0–0.2)

## 2024-03-13 LAB — CULTURE, OB URINE: Culture: <10000 — AB

## 2024-03-13 LAB — RPR: RPR Ser Ql: NONREACTIVE

## 2024-03-13 NOTE — Progress Notes (Signed)
 Patient ID: Kathryn Clark, female   DOB: 04-22-2001, 23 y.o.   MRN: [id number]  S:  Right flank pain improved, now 2/10 (previously 9/10). No fevers or chills, no hematuria. Has not seen passage of stone. +FM. Denies CTX  Questions answered about need for repeat imaging  O:  Vitals:   03/12/24 1613 03/12/24 2000 03/13/24 0424 03/13/24 0808  BP: 106/63 (!) 111/54 (!) 106/51 112/62  Pulse: 87 98 69 87  Resp: 18 18 18 17   Temp: 98.2 F (36.8 C) 98.1 F (36.7 C) 98.1 F (36.7 C) 98.2 F (36.8 C)  TempSrc: Oral Oral Oral Oral  SpO2: 100% 97% 98% 97%  Weight:      Height:       Gen: alert, oriented, pleasant, no distress. Non-toxic appearing Abd: gravid, soft, non-tender, no suprapubic tenderness Ext: no significant edema GU: +R CVA tenderness (mild), -L CVA tenderness  Results for orders placed or performed during the hospital encounter of 03/12/24 (from the past 48 hours)  Urinalysis, Routine w reflex microscopic -Urine, Clean Catch     Status: Abnormal   Collection Time: 03/12/24  5:00 AM  Result Value Ref Range   Color, Urine YELLOW YELLOW   APPearance CLOUDY (A) CLEAR   Specific Gravity, Urine 1.004 (L) 1.005 - 1.030   pH 6.0 5.0 - 8.0   Glucose, UA NEGATIVE NEGATIVE mg/dL   Hgb urine dipstick MODERATE (A) NEGATIVE   Bilirubin Urine NEGATIVE NEGATIVE   Ketones, ur NEGATIVE NEGATIVE mg/dL   Protein, ur 899 (A) NEGATIVE mg/dL   Nitrite NEGATIVE NEGATIVE   Leukocytes,Ua LARGE (A) NEGATIVE   RBC / HPF 0-5 0 - 5 RBC/hpf   WBC, UA >50 0 - 5 WBC/hpf   Bacteria, UA RARE (A) NONE SEEN   Squamous Epithelial / HPF 0-5 0 - 5 /HPF   WBC Clumps PRESENT    Mucus PRESENT     Comment: Performed at [facility], [address] [place], [place] [address]  CBC     Status: Abnormal   Collection Time: 03/12/24  5:47 AM  Result Value Ref Range   WBC 11.3 (H) 4.0 - 10.5 K/uL   RBC 3.77 (L) 3.87 - 5.11 MIL/uL   Hemoglobin 11.4 (L) 12.0 - 15.0 g/dL   HCT 66.6 (L) 63.9 - 53.9 %   MCV  88.3 80.0 - 100.0 fL   MCH 30.2 26.0 - 34.0 pg   MCHC 34.2 30.0 - 36.0 g/dL   RDW 87.8 88.4 - 84.4 %   Platelets 244 150 - 400 K/uL   nRBC 0.0 0.0 - 0.2 %    Comment: Performed at [facility] Lab, [address] [place], [place] [address]  Comprehensive metabolic panel     Status: Abnormal   Collection Time: 03/12/24  5:47 AM  Result Value Ref Range   Sodium 133 (L) 135 - 145 mmol/L   Potassium 3.7 3.5 - 5.1 mmol/L   Chloride 105 98 - 111 mmol/L   CO2 20 (L) 22 - 32 mmol/L   Glucose, Bld 84 70 - 99 mg/dL    Comment: Glucose reference range applies only to samples taken after fasting for at least 8 hours.   BUN 7 6 - 20 mg/dL   Creatinine, Ser 9.38 0.44 - 1.00 mg/dL   Calcium  8.5 (L) 8.9 - 10.3 mg/dL   Total Protein 5.7 (L) 6.5 - 8.1 g/dL   Albumin 2.5 (L) 3.5 - 5.0 g/dL   AST 17 15 -  41 U/L   ALT 11 0 - 44 U/L   Alkaline Phosphatase 166 (H) 38 - 126 U/L   Total Bilirubin 0.6 0.0 - 1.2 mg/dL   GFR, Estimated >39 >39 mL/min    Comment: (NOTE) Calculated using the CKD-EPI Creatinine Equation (2021)    Anion gap 8 5 - 15    Comment: Performed at [facility] Lab, [address] [place], [place] [address]  Type and screen [facility]     Status: None   Collection Time: 03/12/24 10:47 AM  Result Value Ref Range   ABO/RH(D) B POS    Antibody Screen NEG    Sample Expiration      03/15/2024,2359 Performed at [facility] Lab, [address], [place], [place] [address]   RPR     Status: None   Collection Time: 03/12/24 10:48 AM  Result Value Ref Range   RPR Ser Ql NON REACTIVE NON REACTIVE    Comment: Performed at [facility] Lab, [address], [place], [place] [address]  CBC     Status: Abnormal   Collection Time: 03/13/24  5:18 AM  Result Value Ref Range   WBC 7.7 4.0 - 10.5 K/uL   RBC 3.54 (L) 3.87 - 5.11 MIL/uL   Hemoglobin 10.8 (L) 12.0 - 15.0 g/dL   HCT 68.5 (L) 63.9 - 53.9 %   MCV 88.7 80.0 - 100.0 fL   MCH 30.5 26.0 - 34.0 pg   MCHC  34.4 30.0 - 36.0 g/dL   RDW 87.5 88.4 - 84.4 %   Platelets 215 150 - 400 K/uL   nRBC 0.0 0.0 - 0.2 %    Comment: Performed at [facility] Lab, [address], [place], [place] [address]   NST: baseline 135, moderate variability, 2-15x15 accelerations seen, decelerations absent TOCO: none  A/P:  23 yo G3P1011 @ 33.2 HD#2 admitted for pyelonephritis vs nephrolithiasis 1) Flank pain: favor pyelonephritis as leading diagnosis. She has had recurrent UTIs in pregnancy and her symptoms and exam as well as WBC have improved after initiation of IV antibiotics (Rocephin ). Urine negative for blood and no visible stone on imaging. Will continue to strain urine and give tamsulosin  in abundance of caution. Recommend continued hospitalization until urine culture results given history of recurrent UTIs to ensure appropriate coverage and guidance on suppressive therapy. After shared decision making, we opted not to pursue further imaging. Reviewed risk of pyelo and PTL/RDS. 2) Fetal wellbeing: reactive NST. Daily NST 3) Dispo: anticipate DC home tomorrow after culture results and >24 hours of IV antibiotics

## 2024-03-14 LAB — CBC WITH DIFFERENTIAL/PLATELET
Abs Immature Granulocytes: 0.05 K/uL (ref 0.00–0.07)
Basophils Absolute: 0 K/uL (ref 0.0–0.1)
Basophils Relative: 0 %
Eosinophils Absolute: 0.1 K/uL (ref 0.0–0.5)
Eosinophils Relative: 2 %
HCT: 31.4 % — ABNORMAL LOW (ref 36.0–46.0)
Hemoglobin: 10.6 g/dL — ABNORMAL LOW (ref 12.0–15.0)
Immature Granulocytes: 1 %
Lymphocytes Relative: 25 %
Lymphs Abs: 1.9 K/uL (ref 0.7–4.0)
MCH: 30.1 pg (ref 26.0–34.0)
MCHC: 33.8 g/dL (ref 30.0–36.0)
MCV: 89.2 fL (ref 80.0–100.0)
Monocytes Absolute: 0.6 K/uL (ref 0.1–1.0)
Monocytes Relative: 8 %
Neutro Abs: 4.8 K/uL (ref 1.7–7.7)
Neutrophils Relative %: 64 %
Platelets: 214 K/uL (ref 150–400)
RBC: 3.52 MIL/uL — ABNORMAL LOW (ref 3.87–5.11)
RDW: 12.3 % (ref 11.5–15.5)
WBC: 7.4 K/uL (ref 4.0–10.5)
nRBC: 0 % (ref 0.0–0.2)

## 2024-03-14 NOTE — Plan of Care (Signed)
 Pt to be discharged home with printed instructions. VEVA LITTIE POD, RN

## 2024-03-14 NOTE — Discharge Summary (Signed)
 Physician Discharge Summary  Patient ID: Kathryn Clark MRN: [id number] DOB/AGE: August 21, 2000 23 y.o.  Admit date: 03/12/2024 Discharge date: 03/14/2024  Admission Diagnoses: flank pain, concern for pyelonephritis vs. nephrolithiasis  Discharge Diagnoses:  Principal Problem:   Pyelonephritis affecting pregnancy Active Problems:   Pyelonephritis affecting pregnancy in third trimester   Discharged Condition: good  Hospital Course: Kathryn Clark is a 23 yo G3P1011 @ 33.1 to 33.[redacted] weeks pregnant who was hospitalized 8/12-8/14, admitted for pyelonephritis vs nephrolithiasis 1) Flank pain: previously favored pyelonephritis as leading diagnosis. Urine negative for blood, no stones noted with straining, and no visible stone on imaging. She has had recurrent UTIs in pregnancy and her symptoms and exam as well as WBC have improved (now normal WBC, normal diff) after initiation of IV antibiotics (Rocephin ). However, admission urine culture with insignificant growth. Plan continued suppressive therapy as previously prescribed outpatient, no need to discharge with treatment antibiotics.   2) Fetal wellbeing: reactive NST.  Consults: None  Significant Diagnostic Studies: labs: WBC 11.3-> 7.4 and microbiology: urine culture: negative  Treatments: IV hydration, antibiotics: ceftriaxone , and tamulosin  Discharge Exam: Blood pressure (!) 98/59, pulse 84, temperature 97.9 F (36.6 C), temperature source Oral, resp. rate 16, height 5' 5 (1.651 m), weight 100.4 kg, last menstrual period 07/27/2023, SpO2 99%, unknown if currently breastfeeding. Gen: alert, oriented, pleasant, no distress. Non-toxic appearing Abd: gravid, soft, non-tender, no suprapubic tenderness Ext: no significant edema GU: +R CVA tenderness (very mild, per patient significantly improved from prior), -L CVA tenderness  Disposition: Discharge disposition: 01-Home or Self Care       Discharge Instructions     Discharge activity:  No  Restrictions   Complete by: As directed    Discharge diet:  No restrictions   Complete by: As directed    Fetal Kick Count:  Lie on our left side for one hour after a meal, and count the number of times your baby kicks.  If it is less than 5 times, get up, move around and drink some juice.  Repeat the test 30 minutes later.  If it is still less than 5 kicks in an hour, notify your doctor.   Complete by: As directed    No sexual activity restrictions   Complete by: As directed    Notify physician for a general feeling that something is not right   Complete by: As directed    Notify physician for increase or change in vaginal discharge   Complete by: As directed    Notify physician for intestinal cramps, with or without diarrhea, sometimes described as gas pain   Complete by: As directed    Notify physician for leaking of fluid   Complete by: As directed    Notify physician for low, dull backache, unrelieved by heat or Tylenol    Complete by: As directed    Notify physician for menstrual like cramps   Complete by: As directed    Notify physician for pelvic pressure   Complete by: As directed    Notify physician for uterine contractions.  These may be painless and feel like the uterus is tightening or the baby is  balling up   Complete by: As directed    Notify physician for vaginal bleeding   Complete by: As directed    PRETERM LABOR:  Includes any of the follwing symptoms that occur between 20 - [redacted] weeks gestation.  If these symptoms are not stopped, preterm labor can result in preterm delivery, placing your baby at  risk   Complete by: As directed       Allergies as of 03/14/2024       Reactions   Pataday  [olopatadine  Hcl] Other (See Comments)   Redness and swelling to eye        Medication List     STOP taking these medications    oxyCODONE -acetaminophen  5-325 MG tablet Commonly known as: PERCOCET/ROXICET   phenazopyridine  200 MG tablet Commonly known as:  Pyridium        TAKE these medications    cephALEXin  500 MG capsule Commonly known as: KEFLEX  Take 1 capsule (500 mg total) by mouth daily. Start after 7 days of cefadroxil .   multivitamin-prenatal 27-0.8 MG Tabs tablet Take 1 tablet by mouth daily at 12 noon.   ondansetron  4 MG disintegrating tablet Commonly known as: ZOFRAN -ODT Take 1 tablet (4 mg total) by mouth every 8 (eight) hours as needed for nausea or vomiting.        Follow-up Information     [facility]. Schedule an appointment as soon as possible for a visit in 5 day(s).   Contact information: [address]  [address] [place] [address] [phone]                 Signed: Rubie DELENA Husky 03/14/2024, 11:36 AM

## 2024-03-14 NOTE — Progress Notes (Signed)
 Patient ID: Kathryn Clark, female   DOB: 10/05/2000, 23 y.o.   MRN: [id number]  S:  Right flank pain resolved. No fevers or chills, no hematuria. Has not seen passage of stone. +FM. Denies CTX, LOF, or VB.   O:  Vitals:   03/13/24 2014 03/14/24 0032 03/14/24 0900 03/14/24 0902  BP: 113/72 (!) 105/59 (!) 98/59   Pulse: 86 82 84   Resp: 18 16 16    Temp: 98.3 F (36.8 C) 98.3 F (36.8 C) 97.9 F (36.6 C)   TempSrc: Oral Oral Oral   SpO2: 98% 99%  99%  Weight:      Height:       Gen: alert, oriented, pleasant, no distress. Non-toxic appearing Abd: gravid, soft, non-tender, no suprapubic tenderness Ext: no significant edema GU: +R CVA tenderness (very mild, per patient significantly improved from prior), -L CVA tenderness  Results for orders placed or performed during the hospital encounter of 03/12/24 (from the past 48 hours)  CBC     Status: Abnormal   Collection Time: 03/13/24  5:18 AM  Result Value Ref Range   WBC 7.7 4.0 - 10.5 K/uL   RBC 3.54 (L) 3.87 - 5.11 MIL/uL   Hemoglobin 10.8 (L) 12.0 - 15.0 g/dL   HCT 68.5 (L) 63.9 - 53.9 %   MCV 88.7 80.0 - 100.0 fL   MCH 30.5 26.0 - 34.0 pg   MCHC 34.4 30.0 - 36.0 g/dL   RDW 87.5 88.4 - 84.4 %   Platelets 215 150 - 400 K/uL   nRBC 0.0 0.0 - 0.2 %    Comment: Performed at [facility] Lab, [address] [place], [place] [address]  CBC with Differential/Platelet     Status: Abnormal   Collection Time: 03/14/24  5:34 AM  Result Value Ref Range   WBC 7.4 4.0 - 10.5 K/uL   RBC 3.52 (L) 3.87 - 5.11 MIL/uL   Hemoglobin 10.6 (L) 12.0 - 15.0 g/dL   HCT 68.5 (L) 63.9 - 53.9 %   MCV 89.2 80.0 - 100.0 fL   MCH 30.1 26.0 - 34.0 pg   MCHC 33.8 30.0 - 36.0 g/dL   RDW 87.6 88.4 - 84.4 %   Platelets 214 150 - 400 K/uL   nRBC 0.0 0.0 - 0.2 %   Neutrophils Relative % 64 %   Neutro Abs 4.8 1.7 - 7.7 K/uL   Lymphocytes Relative 25 %   Lymphs Abs 1.9 0.7 - 4.0 K/uL   Monocytes Relative 8 %   Monocytes Absolute 0.6 0.1 - 1.0 K/uL    Eosinophils Relative 2 %   Eosinophils Absolute 0.1 0.0 - 0.5 K/uL   Basophils Relative 0 %   Basophils Absolute 0.0 0.0 - 0.1 K/uL   Immature Granulocytes 1 %   Abs Immature Granulocytes 0.05 0.00 - 0.07 K/uL    Comment: Performed at [facility] Lab, [address], [place], [place] [address]   NST: baseline 125, moderate variability, accelerations present, decelerations absent TOCO: none  A/P:  23 yo G3P1011 @ 33.3 HD#3 admitted for pyelonephritis vs nephrolithiasis 1) Flank pain: previously favored pyelonephritis as leading diagnosis. Urine negative for blood, no stones noted with straining, and no visible stone on imaging. She has had recurrent UTIs in pregnancy and her symptoms and exam as well as WBC have improved (now normal WBC, normal diff) after initiation of IV antibiotics (Rocephin ). However, admission urine culture with insignificant growth. Plan continued suppressive therapy as previously prescribed outpatient,  no need to discharge with treatment antibiotics.   2) Fetal wellbeing: reactive NST.  Dispo: discharge home with precautions and follow up early next week.

## 2024-04-03 ENCOUNTER — Encounter (HOSPITAL_COMMUNITY): Payer: Self-pay

## 2024-04-03 NOTE — Patient Instructions (Signed)
 Kathryn Clark  04/03/2024   Your procedure is scheduled on:  04/12/2024  Arrive at 1:15PM at Entrance C on [address] at [facility]  and [employer]. You are invited to use the FREE valet parking or use the Visitor's parking deck.  Pick up the phone at the desk and dial [phone].  Call this number if you have problems the morning of surgery: [phone]  Remember:   Do not eat food:(After Midnight) Desps de medianoche.  You may drink clear liquids until  ___1115__.  Clear liquids means a liquid you can see thru.  It can have color such as Cola or Kool aid.  Tea is OK and coffee as long as no milk or creamer of any kind.  Take these medicines the morning of surgery with A SIP OF WATER:  Cephalexin    Do not wear jewelry, make-up or nail polish.  Do not wear lotions, powders, or perfumes. Do not wear deodorant.  Do not shave 48 hours prior to surgery.  Do not bring valuables to the hospital.  [facility] is not   responsible for any belongings or valuables brought to the hospital.  Contacts, dentures or bridgework may not be worn into surgery.  Leave suitcase in the car. After surgery it may be brought to your room.  For patients admitted to the hospital, checkout time is 11:00 AM the day of              discharge.      Please read over the following fact sheets that you were given:     Preparing for Surgery

## 2024-04-03 NOTE — Patient Instructions (Addendum)
 04/10/1024 Kathryn Clark  04/03/2024   Your procedure is scheduled on:  04/10/2024  Arrive at 1030 at Entrance C on [address] at [facility]  and [employer]. You are invited to use the FREE valet parking or use the Visitor's parking deck.  Pick up the phone at the desk and dial [phone].  Call this number if you have problems the morning of surgery: [phone]  Remember:   Do not eat food:(After Midnight) Desps de medianoche.  You may drink clear liquids until  ___0830__.  Clear liquids means a liquid you can see thru.  It can have color such as Cola or Kool aid.  Tea is OK and coffee as long as no milk or creamer of any kind.  Take these medicines the morning of surgery with A SIP OF WATER:  Cephalexin     Do not wear jewelry, make-up or nail polish.  Do not wear lotions, powders, or perfumes. Do not wear deodorant.  Do not shave 48 hours prior to surgery.  Do not bring valuables to the hospital.  [facility] is not   responsible for any belongings or valuables brought to the hospital.  Contacts, dentures or bridgework may not be worn into surgery.  Leave suitcase in the car. After surgery it may be brought to your room.  For patients admitted to the hospital, checkout time is 11:00 AM the day of              discharge.      Please read over the following fact sheets that you were given:     Preparing for Surgery

## 2024-04-08 ENCOUNTER — Encounter (HOSPITAL_COMMUNITY)
Admission: RE | Admit: 2024-04-08 | Discharge: 2024-04-08 | Disposition: A | Discharge status: Home / Self Care | Source: Ambulatory Visit | Attending: Obstetrics and Gynecology | Admitting: Obstetrics and Gynecology

## 2024-04-08 HISTORY — DX: Supervision of pregnancy with other poor reproductive or obstetric history, unspecified trimester: O09.299

## 2024-04-10 ENCOUNTER — Encounter (HOSPITAL_COMMUNITY)
Admission: RE | Admit: 2024-04-10 | Discharge: 2024-04-10 | Disposition: A | Discharge status: Home / Self Care | Source: Ambulatory Visit | Attending: Obstetrics and Gynecology | Admitting: Obstetrics and Gynecology

## 2024-04-10 DIAGNOSIS — Z01812 Encounter for preprocedural laboratory examination: Secondary | ICD-10-CM | POA: Insufficient documentation

## 2024-04-10 DIAGNOSIS — O34219 Maternal care for unspecified type scar from previous cesarean delivery: Secondary | ICD-10-CM | POA: Insufficient documentation

## 2024-04-10 LAB — CBC
HCT: 37.3 % (ref 36.0–46.0)
Hemoglobin: 12.7 g/dL (ref 12.0–15.0)
MCH: 30.1 pg (ref 26.0–34.0)
MCHC: 34 g/dL (ref 30.0–36.0)
MCV: 88.4 fL (ref 80.0–100.0)
Platelets: 223 K/uL (ref 150–400)
RBC: 4.22 MIL/uL (ref 3.87–5.11)
RDW: 13.2 % (ref 11.5–15.5)
WBC: 9 K/uL (ref 4.0–10.5)
nRBC: 0 % (ref 0.0–0.2)

## 2024-04-10 LAB — RPR: RPR Ser Ql: NONREACTIVE

## 2024-04-10 LAB — TYPE AND SCREEN
ABO/RH(D): B POS
Antibody Screen: NEGATIVE

## 2024-04-12 ENCOUNTER — Inpatient Hospital Stay (HOSPITAL_COMMUNITY): Admitting: Anesthesiology

## 2024-04-12 ENCOUNTER — Inpatient Hospital Stay (HOSPITAL_COMMUNITY)
Admission: RE | Admit: 2024-04-12 | Discharge: 2024-04-14 | DRG: 787 | Disposition: A | Discharge status: Home / Self Care | Attending: Obstetrics and Gynecology | Admitting: Obstetrics and Gynecology

## 2024-04-12 ENCOUNTER — Other Ambulatory Visit: Payer: Self-pay

## 2024-04-12 ENCOUNTER — Encounter (HOSPITAL_COMMUNITY)
Admission: RE | Disposition: A | Discharge status: Home / Self Care | Payer: Self-pay | Source: Home / Self Care | Attending: Obstetrics and Gynecology

## 2024-04-12 ENCOUNTER — Encounter (HOSPITAL_COMMUNITY): Payer: Self-pay | Admitting: Obstetrics and Gynecology

## 2024-04-12 DIAGNOSIS — Z87440 Personal history of urinary (tract) infections: Secondary | ICD-10-CM

## 2024-04-12 DIAGNOSIS — N200 Calculus of kidney: Secondary | ICD-10-CM | POA: Diagnosis present

## 2024-04-12 DIAGNOSIS — Z3A37 37 weeks gestation of pregnancy: Secondary | ICD-10-CM

## 2024-04-12 DIAGNOSIS — O34211 Maternal care for low transverse scar from previous cesarean delivery: Secondary | ICD-10-CM | POA: Diagnosis present

## 2024-04-12 DIAGNOSIS — O26833 Pregnancy related renal disease, third trimester: Secondary | ICD-10-CM | POA: Diagnosis present

## 2024-04-12 DIAGNOSIS — O34219 Maternal care for unspecified type scar from previous cesarean delivery: Principal | ICD-10-CM | POA: Diagnosis present

## 2024-04-12 SURGERY — Surgical Case
Anesthesia: Spinal

## 2024-04-12 MED ORDER — IBUPROFEN 600 MG PO TABS
600.0000 mg | ORAL_TABLET | Freq: Four times a day (QID) | ORAL | Status: DC | PRN
  Administered 2024-04-13 – 2024-04-14 (×4): 600 mg via ORAL
  Filled 2024-04-12 (×4): qty 1

## 2024-04-12 MED ORDER — ONDANSETRON HCL 4 MG/2ML IJ SOLN
INTRAMUSCULAR | Status: AC
  Filled 2024-04-12: qty 2

## 2024-04-12 MED ORDER — MORPHINE SULFATE (PF) 0.5 MG/ML IJ SOLN
INTRAMUSCULAR | Status: AC
  Filled 2024-04-12: qty 10

## 2024-04-12 MED ORDER — PRENATAL MULTIVITAMIN CH
1.0000 | ORAL_TABLET | Freq: Every day | ORAL | Status: DC
  Administered 2024-04-13 – 2024-04-14 (×2): 1 via ORAL
  Filled 2024-04-12 (×2): qty 1

## 2024-04-12 MED ORDER — SCOPOLAMINE 1 MG/3DAYS TD PT72
1.0000 | MEDICATED_PATCH | TRANSDERMAL | Status: DC
  Administered 2024-04-12: 1 mg via TRANSDERMAL
  Filled 2024-04-12: qty 1

## 2024-04-12 MED ORDER — STERILE WATER FOR IRRIGATION IR SOLN
Status: DC | PRN
Start: 2024-04-12 — End: 2024-04-12
  Administered 2024-04-12: 1000 mL

## 2024-04-12 MED ORDER — ACETAMINOPHEN 500 MG PO TABS: 1000.0000 mg | ORAL_TABLET | ORAL | Status: DC

## 2024-04-12 MED ORDER — MENTHOL 3 MG MT LOZG: 1.0000 | LOZENGE | OROMUCOSAL | Status: DC | PRN

## 2024-04-12 MED ORDER — OXYCODONE HCL 5 MG PO TABS: 5.0000 mg | ORAL_TABLET | ORAL | Status: DC | PRN

## 2024-04-12 MED ORDER — SOD CITRATE-CITRIC ACID 500-334 MG/5ML PO SOLN
ORAL | Status: AC
Start: 2024-04-12 — End: 2024-04-12
  Filled 2024-04-12: qty 30

## 2024-04-12 MED ORDER — OXYTOCIN-SODIUM CHLORIDE 30-0.9 UT/500ML-% IV SOLN
2.5000 [IU]/h | INTRAVENOUS | Status: AC
  Filled 2024-04-12: qty 500

## 2024-04-12 MED ORDER — DEXAMETHASONE SODIUM PHOSPHATE 10 MG/ML IJ SOLN
INTRAMUSCULAR | Status: DC | PRN
  Administered 2024-04-12: 10 mg via INTRAVENOUS

## 2024-04-12 MED ORDER — SIMETHICONE 80 MG PO CHEW: 80.0000 mg | CHEWABLE_TABLET | ORAL | Status: DC | PRN

## 2024-04-12 MED ORDER — SIMETHICONE 80 MG PO CHEW
80.0000 mg | CHEWABLE_TABLET | Freq: Three times a day (TID) | ORAL | Status: DC
  Administered 2024-04-13 – 2024-04-14 (×3): 80 mg via ORAL
  Filled 2024-04-12 (×4): qty 1

## 2024-04-12 MED ORDER — POVIDONE-IODINE 10 % EX SWAB
2.0000 | Freq: Once | CUTANEOUS | Status: AC
  Administered 2024-04-12: 2 via TOPICAL

## 2024-04-12 MED ORDER — OXYCODONE HCL 5 MG PO TABS: 5.0000 mg | ORAL_TABLET | Freq: Once | ORAL | Status: DC | PRN

## 2024-04-12 MED ORDER — PHENYLEPHRINE HCL-NACL 20-0.9 MG/250ML-% IV SOLN
INTRAVENOUS | Status: DC | PRN
  Administered 2024-04-12: 60 ug/min via INTRAVENOUS

## 2024-04-12 MED ORDER — CEPHALEXIN 500 MG PO CAPS
500.0000 mg | ORAL_CAPSULE | Freq: Every day | ORAL | Status: DC
  Administered 2024-04-13 – 2024-04-14 (×2): 500 mg via ORAL
  Filled 2024-04-12 (×2): qty 1

## 2024-04-12 MED ORDER — SODIUM CHLORIDE 0.9 % IV SOLN
12.5000 mg | Freq: Four times a day (QID) | INTRAVENOUS | Status: DC | PRN
  Administered 2024-04-12: 12.5 mg via INTRAVENOUS
  Filled 2024-04-12: qty 12.5

## 2024-04-12 MED ORDER — MORPHINE SULFATE (PF) 0.5 MG/ML IJ SOLN
INTRAMUSCULAR | Status: DC | PRN
  Administered 2024-04-12: 150 ug via INTRATHECAL

## 2024-04-12 MED ORDER — CEFAZOLIN SODIUM-DEXTROSE 2-4 GM/100ML-% IV SOLN
INTRAVENOUS | Status: AC
  Filled 2024-04-12: qty 100

## 2024-04-12 MED ORDER — ONDANSETRON HCL 4 MG/2ML IJ SOLN
INTRAMUSCULAR | Status: DC | PRN
  Administered 2024-04-12 (×2): 4 mg via INTRAVENOUS

## 2024-04-12 MED ORDER — CEFAZOLIN SODIUM-DEXTROSE 2-4 GM/100ML-% IV SOLN
2.0000 g | INTRAVENOUS | Status: AC
  Administered 2024-04-12: 2 g via INTRAVENOUS

## 2024-04-12 MED ORDER — OXYCODONE HCL 5 MG/5ML PO SOLN: 5.0000 mg | Freq: Once | ORAL | Status: DC | PRN

## 2024-04-12 MED ORDER — LACTATED RINGERS IV SOLN: INTRAVENOUS | Status: DC

## 2024-04-12 MED ORDER — ZOLPIDEM TARTRATE 5 MG PO TABS: 5.0000 mg | ORAL_TABLET | Freq: Every evening | ORAL | Status: DC | PRN

## 2024-04-12 MED ORDER — FENTANYL CITRATE (PF) 100 MCG/2ML IJ SOLN
INTRAMUSCULAR | Status: AC
  Filled 2024-04-12: qty 2

## 2024-04-12 MED ORDER — GABAPENTIN 100 MG PO CAPS
100.0000 mg | ORAL_CAPSULE | Freq: Two times a day (BID) | ORAL | Status: DC
  Administered 2024-04-12 – 2024-04-13 (×2): 100 mg via ORAL
  Filled 2024-04-12 (×2): qty 1

## 2024-04-12 MED ORDER — ACETAMINOPHEN 10 MG/ML IV SOLN
INTRAVENOUS | Status: DC | PRN
Start: 2024-04-12 — End: 2024-04-12
  Administered 2024-04-12: 1000 mg via INTRAVENOUS

## 2024-04-12 MED ORDER — DIPHENHYDRAMINE HCL 25 MG PO CAPS: 25.0000 mg | ORAL_CAPSULE | Freq: Four times a day (QID) | ORAL | Status: DC | PRN

## 2024-04-12 MED ORDER — FENTANYL CITRATE (PF) 100 MCG/2ML IJ SOLN: 25.0000 ug | INTRAMUSCULAR | Status: DC | PRN

## 2024-04-12 MED ORDER — WITCH HAZEL-GLYCERIN EX PADS: 1.0000 | MEDICATED_PAD | CUTANEOUS | Status: DC | PRN

## 2024-04-12 MED ORDER — SENNOSIDES-DOCUSATE SODIUM 8.6-50 MG PO TABS
2.0000 | ORAL_TABLET | Freq: Every day | ORAL | Status: DC
  Administered 2024-04-13 – 2024-04-14 (×2): 2 via ORAL
  Filled 2024-04-12 (×2): qty 2

## 2024-04-12 MED ORDER — FENTANYL CITRATE (PF) 100 MCG/2ML IJ SOLN
INTRAMUSCULAR | Status: DC | PRN
  Administered 2024-04-12: 15 ug via INTRATHECAL

## 2024-04-12 MED ORDER — METHYLERGONOVINE MALEATE 0.2 MG PO TABS: 0.2000 mg | ORAL_TABLET | ORAL | Status: DC | PRN

## 2024-04-12 MED ORDER — SOD CITRATE-CITRIC ACID 500-334 MG/5ML PO SOLN
30.0000 mL | ORAL | Status: AC
Start: 2024-04-13 — End: 2024-04-12
  Administered 2024-04-12: 30 mL via ORAL

## 2024-04-12 MED ORDER — ACETAMINOPHEN 500 MG PO TABS
1000.0000 mg | ORAL_TABLET | Freq: Four times a day (QID) | ORAL | Status: DC
  Administered 2024-04-12 – 2024-04-14 (×7): 1000 mg via ORAL
  Filled 2024-04-12 (×7): qty 2

## 2024-04-12 MED ORDER — SODIUM CHLORIDE 0.9 % IR SOLN
Status: DC | PRN
Start: 2024-04-12 — End: 2024-04-12
  Administered 2024-04-12: 1

## 2024-04-12 MED ORDER — METHYLERGONOVINE MALEATE 0.2 MG/ML IJ SOLN: 0.2000 mg | INTRAMUSCULAR | Status: DC | PRN

## 2024-04-12 MED ORDER — ONDANSETRON HCL 4 MG/2ML IJ SOLN
4.0000 mg | Freq: Once | INTRAMUSCULAR | Status: AC
Start: 2024-04-12 — End: 2024-04-12
  Administered 2024-04-12: 4 mg via INTRAVENOUS
  Filled 2024-04-12: qty 2

## 2024-04-12 MED ORDER — LACTATED RINGERS IV SOLN
INTRAVENOUS | Status: AC
  Administered 2024-04-13: 1000 mL via INTRAVENOUS

## 2024-04-12 MED ORDER — DIBUCAINE (PERIANAL) 1 % EX OINT: 1.0000 | TOPICAL_OINTMENT | CUTANEOUS | Status: DC | PRN

## 2024-04-12 MED ORDER — DROPERIDOL 2.5 MG/ML IJ SOLN: 0.6250 mg | Freq: Once | INTRAMUSCULAR | Status: DC | PRN

## 2024-04-12 MED ORDER — OXYTOCIN-SODIUM CHLORIDE 30-0.9 UT/500ML-% IV SOLN
INTRAVENOUS | Status: DC | PRN
  Administered 2024-04-12: 300 mL via INTRAVENOUS

## 2024-04-12 MED ORDER — COCONUT OIL OIL: 1.0000 | TOPICAL_OIL | Status: DC | PRN

## 2024-04-12 SURGICAL SUPPLY — 34 items
BENZOIN TINCTURE PRP APPL 2/3 (GAUZE/BANDAGES/DRESSINGS) IMPLANT
CHLORAPREP W/TINT 26 (MISCELLANEOUS) ×2 IMPLANT
CLAMP UMBILICAL CORD (MISCELLANEOUS) ×1 IMPLANT
CLOTH BEACON ORANGE TIMEOUT ST (SAFETY) ×1 IMPLANT
DRAPE C SECTION CLR SCREEN (DRAPES) ×1 IMPLANT
DRSG OPSITE POSTOP 4X10 (GAUZE/BANDAGES/DRESSINGS) ×1 IMPLANT
ELECTRODE REM PT RTRN 9FT ADLT (ELECTROSURGICAL) ×1 IMPLANT
EXTRACTOR VACUUM KIWI (MISCELLANEOUS) IMPLANT
GAUZE PAD ABD 7.5X8 STRL (GAUZE/BANDAGES/DRESSINGS) IMPLANT
GAUZE SPONGE 4X4 12PLY STRL LF (GAUZE/BANDAGES/DRESSINGS) IMPLANT
GLOVE BIO SURGEON STRL SZ 6.5 (GLOVE) ×1 IMPLANT
GLOVE BIOGEL PI IND STRL 7.0 (GLOVE) ×2 IMPLANT
GOWN STRL REUS W/TWL LRG LVL3 (GOWN DISPOSABLE) ×2 IMPLANT
HEMOSTAT ARISTA ABSORB 3G PWDR (HEMOSTASIS) IMPLANT
KIT ABG SYR 3ML LUER SLIP (SYRINGE) IMPLANT
MAT PREVALON FULL STRYKER (MISCELLANEOUS) IMPLANT
NDL HYPO 25X5/8 SAFETYGLIDE (NEEDLE) IMPLANT
NEEDLE HYPO 25X5/8 SAFETYGLIDE (NEEDLE) IMPLANT
NS IRRIG 1000ML POUR BTL (IV SOLUTION) ×1 IMPLANT
PACK C SECTION WH (CUSTOM PROCEDURE TRAY) ×1 IMPLANT
PAD OB MATERNITY 4.3X12.25 (PERSONAL CARE ITEMS) ×1 IMPLANT
RETRACTOR WND ALEXIS 25 LRG (MISCELLANEOUS) ×1 IMPLANT
RTRCTR C-SECT PINK 25CM LRG (MISCELLANEOUS) IMPLANT
STRIP CLOSURE SKIN 1/2X4 (GAUZE/BANDAGES/DRESSINGS) IMPLANT
SUT CHROMIC 1 CTX 36 (SUTURE) ×2 IMPLANT
SUT PLAIN 0 NONE (SUTURE) IMPLANT
SUT PLAIN 2 0 XLH (SUTURE) ×1 IMPLANT
SUT VIC AB 0 CT1 27XBRD ANBCTR (SUTURE) ×2 IMPLANT
SUT VIC AB 2-0 CT1 TAPERPNT 27 (SUTURE) ×1 IMPLANT
SUT VIC AB 3-0 CT1 TAPERPNT 27 (SUTURE) IMPLANT
SUT VIC AB 4-0 KS 27 (SUTURE) ×1 IMPLANT
TOWEL OR 17X24 6PK STRL BLUE (TOWEL DISPOSABLE) ×1 IMPLANT
TRAY FOLEY W/BAG SLVR 14FR LF (SET/KITS/TRAYS/PACK) ×1 IMPLANT
WATER STERILE IRR 1000ML POUR (IV SOLUTION) ×1 IMPLANT

## 2024-04-12 NOTE — Interval H&P Note (Signed)
 History and Physical Interval Note:  No change since h/p done   To OR when ready  C.Rodman Recupero DO   04/12/2024 2:32 PM  Kathryn Clark  has presented today for surgery, with the diagnosis of repeat c-section.  The various methods of treatment have been discussed with the patient and family. After consideration of risks, benefits and other options for treatment, the patient has consented to  Procedure(s) with comments: CESAREAN DELIVERY (N/A) - request OB fellow assist as a surgical intervention.  The patient's history has been reviewed, patient examined, no change in status, stable for surgery.  I have reviewed the patient's chart and labs.  Questions were answered to the patient's satisfaction.     [name]

## 2024-04-12 NOTE — H&P (Signed)
 Kathryn Clark is a 23 y.o. female [phone] presenting for scheduled repeat cesarean section at 68 6/7wks due to recurrent flank pain suspected to be due to nephrolithiasis Given limited treatment options available till after delivery, discussed with MFM and it was thought to be prudent to deliver now. Pt with better access to treatment after delivery.  Hx recurrent uti in this pregnancy - on prophylactic keflex  given consistent neg ucx  History of c/s x 1 ; had wanted TOLAC but 30% success predicted for success thus pt opts for repeat ( also given pain feels better about this option  History of pregnancy induced htn - none this pregnancy   GBS neg. NIPT expected range  OB History     Gravida  3   Para  1   Term  1   Preterm      AB  1   Living  1      SAB      IAB  0   Ectopic      Multiple  0   Live Births  1          Past Medical History:  Diagnosis Date   Arrest of dilation, delivered, current hospitalization 03/17/2019   History of pre-eclampsia in prior pregnancy, currently pregnant    Status post cesarean delivery 03/17/2019   Past Surgical History:  Procedure Laterality Date   CESAREAN SECTION N/A 03/17/2019   Procedure: CESAREAN SECTION;  Surgeon: Delana Ted Morrison, DO;  Location: [facility] LD ORS;  Service: Obstetrics;  Laterality: N/A;   Family History: family history includes Healthy in her father and mother. Social History:  reports that she has never smoked. She has never used smokeless tobacco. She reports that she does not drink alcohol and does not use drugs.     Maternal Diabetes: No Genetic Screening: Normal Maternal Ultrasounds/Referrals: Normal Fetal Ultrasounds or other Referrals:  Referred to Materal Fetal Medicine  Maternal Substance Abuse:  No Significant Maternal Medications:  None Significant Maternal Lab Results:  Group B Strep negative Number of Prenatal Visits:greater than 3 verified prenatal visits Maternal Vaccinations:TDap -  declined  Other Comments:  None  Review of Systems  Constitutional:  Positive for activity change and fatigue. Negative for chills and fever.  Eyes:  Negative for photophobia and visual disturbance.  Respiratory:  Negative for chest tightness and shortness of breath.   Cardiovascular:  Negative for chest pain, palpitations and leg swelling.  Gastrointestinal:  Negative for abdominal pain, diarrhea and nausea.  Genitourinary:  Negative for pelvic pain and vaginal bleeding.  Musculoskeletal:  Positive for back pain and myalgias.  Neurological:  Negative for light-headedness and headaches.  Psychiatric/Behavioral:  The patient is nervous/anxious.    Maternal Medical History:  Reason for admission: Nausea. Scheduled repeat cesarean section   Contractions: Frequency: rare.   Fetal activity: Perceived fetal activity is normal.   Prenatal complications: no prenatal complications Prenatal Complications - Diabetes: none.     Last menstrual period 07/27/2023, unknown if currently breastfeeding. Maternal Exam:  Uterine Assessment: Contraction strength is mild.  Contraction frequency is rare.  Abdomen: Patient reports generalized tenderness.  Surgical scars: low transverse.   Estimated fetal weight is AGA.   Fetal presentation: vertex Introitus: Normal vulva. Vulva is negative for condylomata and lesion.  Normal vagina.  Vagina is negative for condylomata.  Pelvis: of concern for delivery.   Cervix: Cervix evaluated by digital exam.     Fetal Exam Fetal Monitor Review: Baseline rate: 150.  Variability:  moderate (6-25 bpm).     Physical Exam Vitals and nursing note reviewed. Exam conducted with a chaperone present.  Constitutional:      Appearance: She is obese.  Cardiovascular:     Rate and Rhythm: Normal rate.     Pulses: Normal pulses.  Pulmonary:     Effort: Pulmonary effort is normal.  Abdominal:     Tenderness: There is generalized abdominal tenderness.   Genitourinary:    General: Normal vulva.  Vulva is no lesion.  Musculoskeletal:        General: Normal range of motion.     Cervical back: Normal range of motion.  Skin:    General: Skin is warm and dry.     Capillary Refill: Capillary refill takes 2 to 3 seconds.  Neurological:     General: No focal deficit present.     Mental Status: She is alert and oriented to person, place, and time. Mental status is at baseline.  Psychiatric:        Mood and Affect: Mood normal.        Behavior: Behavior normal.        Thought Content: Thought content normal.        Judgment: Judgment normal.     Prenatal labs: ABO, Rh: --/--/B POS (09/10 1031) Antibody: NEG (09/10 1031) Rubella: Immune (02/27 0000) RPR: NON REACTIVE (09/10 1028)  HBsAg: Negative (02/27 0000)  HIV: Non-reactive (02/27 0000)  GBS:     Assessment/Plan: [name] at  94 6/7wks for repeat cesarean section due to persistent pain from nephrolithiasis - Admit - ERAS protocol - GBS neg - To OR when ready    Ted ORN Blessed Girdner 04/12/2024, 12:34 PM

## 2024-04-12 NOTE — Anesthesia Preprocedure Evaluation (Signed)
 Anesthesia Evaluation  Patient identified by MRN, date of birth, ID band Patient awake    Reviewed: Allergy & Precautions, NPO status , Patient's Chart, lab work & pertinent test results  History of Anesthesia Complications Negative for: history of anesthetic complications  Airway Mallampati: II  TM Distance: >3 FB Neck ROM: Full    Dental no notable dental hx. (+) Teeth Intact   Pulmonary neg pulmonary ROS, neg sleep apnea, neg COPD, Patient abstained from smoking.Not current smoker   Pulmonary exam normal breath sounds clear to auscultation       Cardiovascular Exercise Tolerance: Good METS(-) hypertension(-) CAD and (-) Past MI negative cardio ROS (-) dysrhythmias  Rhythm:Regular Rate:Normal - Systolic murmurs    Neuro/Psych negative neurological ROS  negative psych ROS   GI/Hepatic ,neg GERD  ,,(+)     (-) substance abuse    Endo/Other  neg diabetes    Renal/GU Renal disease     Musculoskeletal   Abdominal  (+) + obese  Peds  Hematology Denies blood thinner use or bleeding disorders.    Anesthesia Other Findings Denies blood thinner use or bleeding diatheses. Recent labs reviewed. Past Medical History: 03/17/2019: Arrest of dilation, delivered, current hospitalization No date: History of pre-eclampsia in prior pregnancy, currently  pregnant 03/17/2019: Status post cesarean delivery   Reproductive/Obstetrics (+) Pregnancy                              Anesthesia Physical Anesthesia Plan  ASA: 2  Anesthesia Plan: Spinal   Post-op Pain Management: Ofirmev  IV (intra-op)* and Toradol IV (intra-op)*   Induction:   PONV Risk Score and Plan: 4 or greater and Ondansetron  and Dexamethasone   Airway Management Planned: Natural Airway  Additional Equipment:   Intra-op Plan:   Post-operative Plan:   Informed Consent: I have reviewed the patients History and Physical, chart,  labs and discussed the procedure including the risks, benefits and alternatives for the proposed anesthesia with the patient or authorized representative who has indicated his/her understanding and acceptance.       Plan Discussed with: CRNA and Surgeon  Anesthesia Plan Comments: (Discussed R/B/A of neuraxial anesthesia technique with patient: - rare risks of spinal/epidural hematoma, nerve damage, infection - Risk of PDPH - Risk of itching - Risk of nausea and vomiting - Risk of conversion to general anesthesia and its associated risks, including sore throat, damage to lips/teeth/oropharynx, and rare risks such as cardiac and respiratory events. - Risk of surgical bleeding requiring blood products - Risk of allergic reactions Discussed the role of CRNA in patient's perioperative care.  Patient voiced understanding.)        Anesthesia Quick Evaluation

## 2024-04-12 NOTE — Lactation Note (Signed)
 This note was copied from a baby's chart. Lactation Consultation Note  Patient Name: Kathryn Clark Unijb'd Date: 04/12/2024 Age:25 hours Reason for consult: Initial assessment;Early term 37-38.6wks  P2- RN called reporting that MOB was awake and ready to talk to [facility]. LC consult was not long because MOB was feeling nauseous and was having a difficult time talking to [facility]. MOB reports that infant has latched well so far. LC asked to assess a latch before discharge. MOB reports that infant had just finished eating, but she will call out for a latch assessment when she is feeling well. MOB denies having any questions or concerns.  LC reviewed the first 24 hr birthday nap, day 2 cluster feeding, feeding infant on cue 8-12x in 24 hrs, not allowing infant to go over 3 hrs without a feeding, CDC milk storage guidelines, LC services handout and engorgement/breast care. LC encouraged MOB to call for further assistance as needed.  Maternal Data Has patient been taught Hand Expression?: No Does the patient have breastfeeding experience prior to this delivery?: Yes  Feeding Mother's Current Feeding Choice: Breast Milk  Lactation Tools Discussed/Used Pump Education: Milk Storage  Interventions Interventions: Breast feeding basics reviewed;Education;LC Services brochure  Discharge Discharge Education: Engorgement and breast care;Warning signs for feeding baby Pump: DEBP;Personal  Consult Status Consult Status: Follow-up Date: 04/13/24 Follow-up type: In-patient    Recardo Hoit BS, IBCLC 04/12/2024, 9:03 PM

## 2024-04-12 NOTE — Anesthesia Postprocedure Evaluation (Signed)
 Anesthesia Post Note  Patient: Kathryn Clark  Procedure(s) Performed: CESAREAN DELIVERY     Patient location during evaluation: L&D Anesthesia Type: Spinal Level of consciousness: oriented and awake and alert Pain management: pain level controlled Vital Signs Assessment: post-procedure vital signs reviewed and stable Respiratory status: spontaneous breathing, respiratory function stable and patient connected to nasal cannula oxygen Cardiovascular status: blood pressure returned to baseline and stable Postop Assessment: no headache, no backache, no apparent nausea or vomiting and spinal receding Anesthetic complications: no   No notable events documented.  Last Vitals:  Vitals:   04/12/24 1655 04/12/24 1710  BP:  (!) 135/91  Pulse: (!) 59 63  Resp: 14 16  Temp:  36.6 C  SpO2: 100% 100%    Last Pain:  Vitals:   04/12/24 1710  TempSrc: Oral  PainSc:    Pain Goal:    LLE Motor Response: No movement due to regional block (04/12/24 1655) LLE Sensation: Tingling (04/12/24 1655) RLE Motor Response: No movement due to regional block (04/12/24 1655) RLE Sensation: Tingling (04/12/24 1655)     Epidural/Spinal Function Cutaneous sensation: Able to Discern Pressure (04/12/24 1655), Patient able to flex knees: No (04/12/24 1655), Patient able to lift hips off bed: No (04/12/24 1655), Back pain beyond tenderness at insertion site: No (04/12/24 1655), Progressively worsening motor and/or sensory loss: No (04/12/24 1655), Bowel and/or bladder incontinence post epidural: No (04/12/24 1655)  Rome Ade

## 2024-04-12 NOTE — Lactation Note (Signed)
 This note was copied from a baby's chart. Lactation Consultation Note  Patient Name: Kathryn Clark Unijb'd Date: 04/12/2024 Age:25 hours   P2- MOB was asleep when LC entered the room. LC requested for RN to call LC tonight when MOB is awake.  Recardo Hoit BS, IBCLC 04/12/2024, 7:25 PM

## 2024-04-12 NOTE — Op Note (Addendum)
 Operative Note    Preoperative Diagnosis IUP at 37 6/7wks  2.   Previous cesarean section x 1; elective repeat  3.   Recurrent pain from nephrolithiasis   Postoperative Diagnosis: Same  4. Nuchal cord x 2   Procedure: Repeat low transverse cesarean section with no extensions. Vacuum assistance with 1 pull ;no pop off. Single layered closure    Surgeon: Delana BROCKS DO  Assist: Magali, C MD   An experienced assistant was required given the standard of surgical care given the complexity of the case and maternal body habitus.  This assistant was needed for exposure, dissection, suctioning, retraction, instrument exchange, assisting with delivery with administration of fundal pressure, and for overall help during the procedure.    Anesthesia: Spinal   Fluids: LR 1300 EBL: UOP: Meds: TXA, Ancef , tylenol    Findings: Viable female infant in vertex position. Grossly normal uterus tubes and ovaries.  APGARS 8,9, Weight pending    Specimen: Placenta to pathology    Procedure Note  Consent verified pre-op. All questions answered   Patient was taken to the operating room where spinal anesthesia was administered. She was prepped and draped in the normal sterile fashion and placed in the dorsal supine position with a leftward tilt. An appropriate time out was performed.  A Pfannenstiel skin incision was then made through the previous incision with the scalpel and carried through to the underlying layer of fascia by sharp dissection and Bovie cautery. The fascia was nicked in the midline and the incision was extended laterally with Mayo scissors. The superior and inferior aspects of the incision were grasped using kocher clamps and dissected off the underlying rectus muscles. The rectus muscles were separated in the midline  and the peritoneal cavity entered bluntly. The peritoneal incision was then extended both superiorly and inferiorly with careful attention to avoid both bowel  and bladder. The Alexis self-retaining wound retractor was then placed and the lower uterine segment exposed. The bladder flap was developed with Metzenbaum scissors and pushed away from the lower uterine segment. The lower uterine segment was then incised in a transverse fashion and the cavity itself entered bluntly. The incision was extended bluntly. The infant's head was then lifted and delivered from the incision with one pull of the kiwi vacuum ; no pop off. Nuchal x 2 was noted.  Weak cry was elicited during delivery. Given bloody fluid from anterior placenta, baby handed off to NICU staff for suction prior to encouraging vigorous cry.  The placenta was then manually expressed from the uterus and the uterus cleared of all clots and debris with moist lap sponge. The uterine incision was then repaired in one running locked layer using 1-0 chromic. Hemostasis noted.   The tubes and ovaries were inspected and the gutters cleared of all clots and debris. The uterine incision was inspected again and confirmed to still be hemostatic. All instruments and sponges were then removed from the abdomen. The peritoneum was then reapproximated in a running fashion with sutures of 2-0 Vicryl.  The fascia was then closed with 0 Vicryl in a running fashion.  The skin was closed with a subcuticular stitch of 4-0 Vicryl on a Keith needle and then reinforced with benzoin, steri-strips, honeycomb and a pressure dressing.  At the conclusion of the procedure all instruments and sponge counts were correct x 3. Patient was taken to the recovery room in good condition with her baby accompanying her skin to skin.

## 2024-04-12 NOTE — Transfer of Care (Signed)
 Immediate Anesthesia Transfer of Care Note  Patient: Kathryn Clark  Procedure(s) Performed: CESAREAN DELIVERY  Patient Location: PACU  Anesthesia Type:Spinal  Level of Consciousness: awake, alert , and oriented  Airway & Oxygen Therapy: Patient Spontanous Breathing  Post-op Assessment: Report given to RN and Post -op Vital signs reviewed and stable  Post vital signs: Reviewed and stable  Last Vitals:  Vitals Value Taken Time  BP 122/73 04/12/24 16:15  Temp 36.3 C 04/12/24 15:55  Pulse 63 04/12/24 16:22  Resp 13 04/12/24 16:22  SpO2 98 % 04/12/24 16:22  Vitals shown include unfiled device data.  Last Pain:  Vitals:   04/12/24 1615  TempSrc:   PainSc: 0-No pain         Complications: No notable events documented.

## 2024-04-13 LAB — CBC
HCT: 30.7 % — ABNORMAL LOW (ref 36.0–46.0)
Hemoglobin: 10.6 g/dL — ABNORMAL LOW (ref 12.0–15.0)
MCH: 30.1 pg (ref 26.0–34.0)
MCHC: 34.5 g/dL (ref 30.0–36.0)
MCV: 87.2 fL (ref 80.0–100.0)
Platelets: 201 K/uL (ref 150–400)
RBC: 3.52 MIL/uL — ABNORMAL LOW (ref 3.87–5.11)
RDW: 13.3 % (ref 11.5–15.5)
WBC: 11.3 K/uL — ABNORMAL HIGH (ref 4.0–10.5)
nRBC: 0 % (ref 0.0–0.2)

## 2024-04-13 MED ORDER — GABAPENTIN 100 MG PO CAPS
100.0000 mg | ORAL_CAPSULE | Freq: Three times a day (TID) | ORAL | Status: DC
  Administered 2024-04-13 – 2024-04-14 (×3): 100 mg via ORAL
  Filled 2024-04-13 (×3): qty 1

## 2024-04-13 NOTE — Progress Notes (Signed)
 Subjective: Postpartum Day 1: Cesarean Delivery Patient reports some difficulty with urinating since Foley catheter removed.  Was able to urinate a small volume.  Does not currently feel like she needs to pee.  Pain controlled.  Denies nausea or vomiting.  Ambulating without difficulty.  Objective: Vital signs in last 24 hours: Temp:  [97.1 F (36.2 C)-98.3 F (36.8 C)] 98 F (36.7 C) (09/13 0900) Pulse Rate:  [59-77] 71 (09/12 2355) Resp:  [14-21] 18 (09/13 0900) BP: (108-135)/(52-91) 108/72 (09/13 0900) SpO2:  [95 %-100 %] 98 % (09/13 0900) Weight:  [102.1 kg] 102.1 kg (09/12 1341)  Physical Exam:  General: alert, cooperative, and appears stated age Lochia: appropriate Uterine Fundus: firm Incision: Pressure dressing clean/dry/intact. DVT Evaluation: No evidence of DVT seen on physical exam.  Recent Labs    04/13/24 0658  HGB 10.6*  HCT 30.7*    Assessment/Plan: Status post Cesarean section. Doing well postoperatively.  Continue current care. Will monitor urination.  Consider bladder scan if unable to urinate.  If significant urinary retention would replace Foley catheter. Marjorie Gull, MD 04/13/2024, 11:22 AM

## 2024-04-14 MED ORDER — IBUPROFEN 600 MG PO TABS: 600.0000 mg | ORAL_TABLET | Freq: Four times a day (QID) | ORAL | 0 refills | Status: AC | PRN

## 2024-04-14 MED ORDER — OXYCODONE HCL 5 MG PO TABS: 5.0000 mg | ORAL_TABLET | ORAL | 0 refills | Status: AC | PRN

## 2024-04-14 NOTE — Lactation Note (Signed)
 This note was copied from a baby's chart. Lactation Consultation Note  Patient Name: Kathryn Clark Date: 04/14/2024 Age:25 hours Reason for consult: Follow-up assessment;Early term 37-38.6wks  P2, Baby [redacted]w[redacted]d.  Mother states baby recently breastfed for 10 min. Discussed if baby becomes sleepy at the breast, post pump and give volume to baby. Reviewed engorgement care and monitoring voids/stools. No questions or concerns at this time.    Maternal Data Has patient been taught Hand Expression?: Yes  Feeding Mother's Current Feeding Choice: Breast Milk  Interventions Interventions: Education  Discharge Discharge Education: Engorgement and breast care Pump: DEBP  Consult Status Consult Status: Complete Date: 04/14/24   Shannon Levorn Lemme  RN, IBCLC 04/14/2024, 12:25 PM

## 2024-04-14 NOTE — Discharge Summary (Signed)
 Postpartum Discharge Summary       Patient Name: Kathryn Clark DOB: Sep 16, 2000 MRN: [id number]  Date of admission: 04/12/2024 Delivery date:04/12/2024 Delivering provider: DELANA BASE [facility] Date of discharge: 04/14/2024  Admitting diagnosis: History of cesarean section [Z98.891] Previous cesarean delivery affecting pregnancy [O34.219] Intrauterine pregnancy: [redacted]w[redacted]d     Secondary diagnosis:  Principal Problem:   Previous cesarean delivery affecting pregnancy  Additional problems: Recurrent UTIs    Discharge diagnosis: Term Pregnancy Delivered                                              Post partum procedures:Not applicable Augmentation: N/A Complications: None  Hospital course: Sceduled C/S   23 y.o. yo [id number] at [redacted]w[redacted]d was admitted to the hospital 04/12/2024 for scheduled cesarean section with the following indication:Repeat cesarean section, .Delivery details are as follows:  Membrane Rupture Time/Date: 3:08 PM,04/12/2024  Delivery Method:C-Section, Vacuum Assisted Operative Delivery: Details of operation can be found in separate operative note.  Patient had a postpartum course complicated by nothing.  She is ambulating, tolerating a regular diet, passing flatus, and urinating well. Patient is discharged home in stable condition on  04/14/24        Newborn Data: Birth date:04/12/2024 Birth time:3:08 PM Gender:Female Living status:Living Apgars:7 ,9  Weight:2760 g    Magnesium  Sulfate received: No BMZ received: No Immunizations administered: Immunization History  Administered Date(s) Administered   Tdap 03/05/2015    Physical exam  Vitals:   04/13/24 0900 04/13/24 1400 04/13/24 2019 04/14/24 0509  BP: 108/72 108/64 113/61 112/65  Pulse:  74 78 79  Resp: 18 18 18 18   Temp: 98 F (36.7 C) 98.5 F (36.9 C) 98.1 F (36.7 C) 97.9 F (36.6 C)  TempSrc:  Oral Oral Oral  SpO2: 98% 98%    Weight:      Height:       General: alert, cooperative, and no  distress Lochia: appropriate Uterine Fundus: firm Incision: Healing well with no significant drainage DVT Evaluation: No evidence of DVT seen on physical exam. Labs: Lab Results  Component Value Date   WBC 11.3 (H) 04/13/2024   HGB 10.6 (L) 04/13/2024   HCT 30.7 (L) 04/13/2024   MCV 87.2 04/13/2024   PLT 201 04/13/2024      Latest Ref Rng & Units 03/12/2024    5:47 AM  CMP  Glucose 70 - 99 mg/dL 84   BUN 6 - 20 mg/dL 7   Creatinine 9.55 - 8.99 mg/dL 9.38   Sodium 864 - 854 mmol/L 133   Potassium 3.5 - 5.1 mmol/L 3.7   Chloride 98 - 111 mmol/L 105   CO2 22 - 32 mmol/L 20   Calcium  8.9 - 10.3 mg/dL 8.5   Total Protein 6.5 - 8.1 g/dL 5.7   Total Bilirubin 0.0 - 1.2 mg/dL 0.6   Alkaline Phos 38 - 126 U/L 166   AST 15 - 41 U/L 17   ALT 0 - 44 U/L 11    Edinburgh Score:    03/18/2019    8:00 AM  Edinburgh Postnatal Depression Scale Screening Tool  I have been able to laugh and see the funny side of things. 0  I have looked forward with enjoyment to things. 0  I have blamed myself unnecessarily when things went wrong. 0  I have been anxious or worried for  no good reason. 2  I have felt scared or panicky for no good reason. 2  Things have been getting on top of me. 0  I have been so unhappy that I have had difficulty sleeping. 0  I have felt sad or miserable. 0  I have been so unhappy that I have been crying. 0  The thought of harming myself has occurred to me. 0  Edinburgh Postnatal Depression Scale Total 4      Data saved with a previous flowsheet row definition      After visit meds:  Allergies as of 04/14/2024       Reactions   Pataday  [olopatadine  Hcl] Other (See Comments)   Redness and swelling to eye        Medication List     STOP taking these medications    cephALEXin  500 MG capsule Commonly known as: KEFLEX    ondansetron  4 MG disintegrating tablet Commonly known as: ZOFRAN -ODT       TAKE these medications    ibuprofen  600 MG  tablet Commonly known as: ADVIL  Take 1 tablet (600 mg total) by mouth every 6 (six) hours as needed.   multivitamin-prenatal 27-0.8 MG Tabs tablet Take 1 tablet by mouth daily at 12 noon.   oxyCODONE  5 MG immediate release tablet Commonly known as: Roxicodone  Take 1 tablet (5 mg total) by mouth every 4 (four) hours as needed.               Discharge Care Instructions  (From admission, onward)           Start     Ordered   04/14/24 0000  Discharge wound care:       Comments: For a cesarean delivery: You may wash incision with soap and water .  Do not soak or submerge the incision for 2 weeks. Keep incision dry. You may need to keep a sanitary pad or panty liner between the incision and your clothing for comfort and to keep the incision dry. If you note drainage, increased pain, or increased redness of the incision, then please notify your physician.   04/14/24 1137   04/14/24 0000  If the dressing is still on your incision site when you go home, remove it on the third day after your surgery date. Remove dressing if it begins to fall off, or if it is dirty or damaged before the third day.       Comments: For a cesarean delivery   04/14/24 1137             Discharge home in stable condition Infant Feeding: Bottle and Breast Infant Disposition:home with mother Discharge instruction: per After Visit Summary and Postpartum booklet. Activity: Advance as tolerated. Pelvic rest for 6 weeks.  Diet: routine diet Anticipated Birth Control: Unsure Postpartum Appointment:2 weeks Future Appointments:No future appointments. Follow up Visit:  Follow-up Information     [facility] Follow up in 2 day(s).   Why: Follow-up with [facility] in 2 weeks for an incision check Contact information: [address]  [address] [place] [address] [phone]                     04/14/2024 Marjorie Gull, MD

## 2024-04-23 ENCOUNTER — Telehealth (HOSPITAL_COMMUNITY): Payer: Self-pay | Admitting: *Deleted

## 2024-04-23 NOTE — Telephone Encounter (Signed)
 04/23/2024  Name: Kealey Kemmer MRN: [id number] DOB: [birth year]  Reason for Call:  Transition of Care Hospital Discharge Call  Contact Status: Patient Contact Status: Complete  Language assistant needed: Interpreter Mode: Interpreter Not Needed        Follow-Up Questions: Do You Have Any Concerns About Your Health As You Heal From Delivery?: No Do You Have Any Concerns About Your Infants Health?: No  Edinburgh Postnatal Depression Scale:  In the Past 7 Days:    PHQ2-9 Depression Scale:     Discharge Follow-up: Edinburgh score requires follow up?:  (says answers are the same as in the hospital when score was 0, endorses she is doing well emotionally) Patient was advised of the following resources:: Support Group, Breastfeeding Support Group (declines postpartum group information via email)  Post-discharge interventions: Reviewed Newborn Safe Sleep Practices  Mliss Sieve, RN 04/23/2024 11:33
